# Patient Record
Sex: Female | Born: 1965
Health system: Southern US, Community
[De-identification: ages and names within clinical notes are randomized; demographics above are authoritative.]

## PROBLEM LIST (undated history)

## (undated) DIAGNOSIS — E079 Disorder of thyroid, unspecified: Secondary | ICD-10-CM

## (undated) HISTORY — PX: BREAST EXCISIONAL BIOPSY: SUR124

## (undated) HISTORY — PX: BREAST BIOPSY: SHX20

---

## 2007-01-18 ENCOUNTER — Ambulatory Visit: Payer: Self-pay | Admitting: Internal Medicine

## 2013-05-24 DIAGNOSIS — E890 Postprocedural hypothyroidism: Secondary | ICD-10-CM | POA: Insufficient documentation

## 2013-12-04 ENCOUNTER — Ambulatory Visit: Payer: Self-pay | Admitting: Internal Medicine

## 2013-12-04 LAB — CBC CANCER CENTER
BANDS NEUTROPHIL: 1 %
EOS PCT: 2 %
HCT: 24.9 % — AB (ref 35.0–47.0)
HGB: 7.1 g/dL — ABNORMAL LOW (ref 12.0–16.0)
LYMPHS PCT: 31 %
MCH: 18.3 pg — ABNORMAL LOW (ref 26.0–34.0)
MCHC: 28.4 g/dL — AB (ref 32.0–36.0)
MCV: 64 fL — ABNORMAL LOW (ref 80–100)
Monocytes: 4 %
Platelet: 261 x10 3/mm (ref 150–440)
RBC: 3.87 10*6/uL (ref 3.80–5.20)
RDW: 20.3 % — ABNORMAL HIGH (ref 11.5–14.5)
Segmented Neutrophils: 62 %
WBC: 4.6 x10 3/mm (ref 3.6–11.0)

## 2013-12-04 LAB — IRON AND TIBC
IRON SATURATION: 3 %
Iron Bind.Cap.(Total): 431 ug/dL (ref 250–450)
Iron: 15 ug/dL — ABNORMAL LOW (ref 50–170)
Unbound Iron-Bind.Cap.: 416 ug/dL

## 2013-12-04 LAB — PROTIME-INR
INR: 1
PROTHROMBIN TIME: 12.9 s (ref 11.5–14.7)

## 2013-12-04 LAB — FERRITIN: Ferritin (ARMC): 3 ng/mL — ABNORMAL LOW (ref 8–388)

## 2013-12-04 LAB — RETICULOCYTES
Absolute Retic Count: 0.0573 10*6/uL (ref 0.019–0.186)
Reticulocyte: 1.48 % (ref 0.4–3.1)

## 2013-12-04 LAB — FOLATE: Folic Acid: 9.8 ng/mL (ref 3.1–100.0)

## 2013-12-04 LAB — LACTATE DEHYDROGENASE: LDH: 215 U/L (ref 81–246)

## 2013-12-04 LAB — APTT: ACTIVATED PTT: 30.4 s (ref 23.6–35.9)

## 2013-12-06 LAB — URINE IEP, RANDOM

## 2013-12-06 LAB — PROT IMMUNOELECTROPHORES(ARMC)

## 2013-12-22 ENCOUNTER — Ambulatory Visit: Payer: Self-pay | Admitting: Internal Medicine

## 2015-03-18 ENCOUNTER — Other Ambulatory Visit: Payer: Self-pay | Admitting: Nurse Practitioner

## 2015-03-18 ENCOUNTER — Other Ambulatory Visit: Payer: Self-pay | Admitting: Internal Medicine

## 2015-03-19 ENCOUNTER — Other Ambulatory Visit: Payer: Self-pay | Admitting: Nurse Practitioner

## 2015-03-19 DIAGNOSIS — Z1231 Encounter for screening mammogram for malignant neoplasm of breast: Secondary | ICD-10-CM

## 2016-04-12 ENCOUNTER — Other Ambulatory Visit: Payer: Self-pay | Admitting: Nurse Practitioner

## 2016-04-12 DIAGNOSIS — Z0000 Encounter for general adult medical examination without abnormal findings: Secondary | ICD-10-CM

## 2016-04-12 DIAGNOSIS — Z1239 Encounter for other screening for malignant neoplasm of breast: Secondary | ICD-10-CM

## 2016-10-11 DIAGNOSIS — E782 Mixed hyperlipidemia: Secondary | ICD-10-CM | POA: Diagnosis not present

## 2016-10-11 DIAGNOSIS — Z79899 Other long term (current) drug therapy: Secondary | ICD-10-CM | POA: Diagnosis not present

## 2016-10-11 DIAGNOSIS — E890 Postprocedural hypothyroidism: Secondary | ICD-10-CM | POA: Diagnosis not present

## 2017-04-28 ENCOUNTER — Other Ambulatory Visit: Payer: Self-pay | Admitting: Nurse Practitioner

## 2017-04-28 DIAGNOSIS — E782 Mixed hyperlipidemia: Secondary | ICD-10-CM | POA: Diagnosis not present

## 2017-04-28 DIAGNOSIS — Z0000 Encounter for general adult medical examination without abnormal findings: Secondary | ICD-10-CM | POA: Diagnosis not present

## 2017-04-28 DIAGNOSIS — R7302 Impaired glucose tolerance (oral): Secondary | ICD-10-CM | POA: Diagnosis not present

## 2017-04-28 DIAGNOSIS — Z1231 Encounter for screening mammogram for malignant neoplasm of breast: Secondary | ICD-10-CM

## 2017-04-28 DIAGNOSIS — E890 Postprocedural hypothyroidism: Secondary | ICD-10-CM | POA: Diagnosis not present

## 2017-04-28 DIAGNOSIS — Z79899 Other long term (current) drug therapy: Secondary | ICD-10-CM | POA: Diagnosis not present

## 2017-05-30 ENCOUNTER — Encounter: Payer: Self-pay | Admitting: Radiology

## 2017-05-30 ENCOUNTER — Ambulatory Visit
Admission: RE | Admit: 2017-05-30 | Discharge: 2017-05-30 | Disposition: A | Discharge status: Home / Self Care | Payer: 59 | Source: Ambulatory Visit | Attending: Nurse Practitioner | Admitting: Nurse Practitioner

## 2017-05-30 DIAGNOSIS — R928 Other abnormal and inconclusive findings on diagnostic imaging of breast: Secondary | ICD-10-CM | POA: Diagnosis not present

## 2017-05-30 DIAGNOSIS — Z1231 Encounter for screening mammogram for malignant neoplasm of breast: Secondary | ICD-10-CM | POA: Diagnosis present

## 2017-06-01 ENCOUNTER — Other Ambulatory Visit: Payer: Self-pay | Admitting: *Deleted

## 2017-06-01 ENCOUNTER — Inpatient Hospital Stay
Admission: RE | Admit: 2017-06-01 | Discharge: 2017-06-01 | Disposition: A | Discharge status: Home / Self Care | Payer: Self-pay | Source: Ambulatory Visit | Attending: *Deleted | Admitting: *Deleted

## 2017-06-01 DIAGNOSIS — Z9289 Personal history of other medical treatment: Secondary | ICD-10-CM

## 2017-06-02 ENCOUNTER — Other Ambulatory Visit: Payer: Self-pay | Admitting: Nurse Practitioner

## 2017-06-02 DIAGNOSIS — R928 Other abnormal and inconclusive findings on diagnostic imaging of breast: Secondary | ICD-10-CM

## 2017-06-02 DIAGNOSIS — N6489 Other specified disorders of breast: Secondary | ICD-10-CM

## 2017-06-09 ENCOUNTER — Ambulatory Visit
Admission: RE | Admit: 2017-06-09 | Discharge: 2017-06-09 | Disposition: A | Discharge status: Home / Self Care | Payer: 59 | Source: Ambulatory Visit | Attending: Nurse Practitioner | Admitting: Nurse Practitioner

## 2017-06-09 DIAGNOSIS — R928 Other abnormal and inconclusive findings on diagnostic imaging of breast: Secondary | ICD-10-CM | POA: Diagnosis present

## 2017-06-09 DIAGNOSIS — N6489 Other specified disorders of breast: Secondary | ICD-10-CM | POA: Diagnosis not present

## 2017-08-01 DIAGNOSIS — Z1211 Encounter for screening for malignant neoplasm of colon: Secondary | ICD-10-CM | POA: Diagnosis not present

## 2017-08-01 DIAGNOSIS — Z5181 Encounter for therapeutic drug level monitoring: Secondary | ICD-10-CM | POA: Diagnosis not present

## 2017-08-01 DIAGNOSIS — E039 Hypothyroidism, unspecified: Secondary | ICD-10-CM | POA: Diagnosis not present

## 2017-08-01 DIAGNOSIS — Z01818 Encounter for other preprocedural examination: Secondary | ICD-10-CM | POA: Diagnosis not present

## 2017-08-19 DIAGNOSIS — D123 Benign neoplasm of transverse colon: Secondary | ICD-10-CM | POA: Diagnosis not present

## 2017-08-19 DIAGNOSIS — K648 Other hemorrhoids: Secondary | ICD-10-CM | POA: Diagnosis not present

## 2017-08-19 DIAGNOSIS — K640 First degree hemorrhoids: Secondary | ICD-10-CM | POA: Diagnosis not present

## 2017-08-19 DIAGNOSIS — D126 Benign neoplasm of colon, unspecified: Secondary | ICD-10-CM | POA: Diagnosis not present

## 2017-08-19 DIAGNOSIS — Z1211 Encounter for screening for malignant neoplasm of colon: Secondary | ICD-10-CM | POA: Diagnosis not present

## 2017-08-22 ENCOUNTER — Encounter: Payer: Self-pay | Admitting: Emergency Medicine

## 2017-08-22 ENCOUNTER — Other Ambulatory Visit: Payer: Self-pay

## 2017-08-22 ENCOUNTER — Ambulatory Visit (INDEPENDENT_AMBULATORY_CARE_PROVIDER_SITE_OTHER): Payer: 59

## 2017-08-22 ENCOUNTER — Ambulatory Visit
Admission: EM | Admit: 2017-08-22 | Discharge: 2017-08-22 | Disposition: A | Discharge status: Home / Self Care | Payer: 59 | Attending: Family Medicine | Admitting: Family Medicine

## 2017-08-22 DIAGNOSIS — M79671 Pain in right foot: Secondary | ICD-10-CM

## 2017-08-22 DIAGNOSIS — W19XXXA Unspecified fall, initial encounter: Secondary | ICD-10-CM

## 2017-08-22 DIAGNOSIS — S92354A Nondisplaced fracture of fifth metatarsal bone, right foot, initial encounter for closed fracture: Secondary | ICD-10-CM

## 2017-08-22 HISTORY — DX: Disorder of thyroid, unspecified: E07.9

## 2017-08-22 NOTE — ED Provider Notes (Signed)
[facility] ____________________________________________  Time seen: Approximately 4:31 PM  I have reviewed the triage vital signs and the nursing notes.   HISTORY  Chief Complaint Foot Pain (right)   HPI Bailey Dennis is a 52 y.o. female presenting for evaluation of right lateral foot pain that is been present for the last 1 week.  Patient reports last week she had been sitting in her recliner with her right foot underneath her leg on her laptop.  States when she went to stand up quickly her foot had fallen asleep, and when she stepped caused her to roll her foot and fall.  States she heard a pop to her foot with associated pain.  Denies any other pain or injuries from fall.  States that she has been given at time but pain continues prompting her to come in.  States did feel some slight paresthesias today, no loss of sensation completely.  Denies further pain radiation or other injury.  Reports has not taken over-the-counter medications due to recent colonoscopy that was scheduled for this past week.  Reports otherwise feels well.  His continue remain ambulatory with pain. Denies recent sickness. Denies recent antibiotic use.    Past Medical History:  Diagnosis Date  . Thyroid disease     There are no active problems to display for this patient.   Past Surgical History:  Procedure Laterality Date  . BREAST BIOPSY Left    neg     No current facility-administered medications for this encounter.   Current Outpatient Medications:  .  ferrous sulfate 325 (65 FE) MG tablet, Take by mouth., Disp: , Rfl:  .  levothyroxine (SYNTHROID, LEVOTHROID) 175 MCG tablet, Take 175 mcg by mouth daily before breakfast., Disp: , Rfl:  .  Multiple Vitamin (MULTI-VITAMINS) TABS, Take by mouth., Disp: , Rfl:   Allergies Patient has no known allergies.  Family History  Problem Relation Age of Onset  . Breast cancer Mother 40  . Diabetes Father     Social History Social History     Tobacco Use  . Smoking status: Never Smoker  . Smokeless tobacco: Never Used  Substance Use Topics  . Alcohol use: Yes    Comment: occassionally  . Drug use: Never    Review of Systems Constitutional: No fever/chills Cardiovascular: Denies chest pain. Respiratory: Denies shortness of breath. Gastrointestinal: No abdominal pain.  Musculoskeletal: Negative for back pain. As above.  Skin: Negative for rash.  ____________________________________________   PHYSICAL EXAM:  VITAL SIGNS: ED Triage Vitals  Enc Vitals Group     BP 08/22/17 1501 126/83     Pulse Rate 08/22/17 1501 92     Resp 08/22/17 1501 16     Temp 08/22/17 1501 98.4 F (36.9 C)     Temp Source 08/22/17 1501 Oral     SpO2 08/22/17 1501 100 %     Weight 08/22/17 1501 194 lb (88 kg)     Height 08/22/17 1501 5\' 7"  (1.702 m)     Head Circumference --      Peak Flow --      Pain Score 08/22/17 1500 3     Pain Loc --      Pain Edu? --      Excl. in [place]? --     Constitutional: Alert and oriented. Well appearing and in no acute distress. Cardiovascular: Normal rate, regular rhythm. Grossly normal heart sounds.  Good peripheral circulation. Respiratory: Normal respiratory effort without tachypnea nor retractions. Breath sounds are  clear and equal bilaterally. No wheezes, rales, rhonchi. Musculoskeletal:   No midline cervical, thoracic or lumbar tenderness to palpation. Bilateral pedal pulses equal and easily palpated. Except: Right lateral foot at base of fifth metatarsal mild to moderate tenderness to direct palpation with mild associated swelling, no ecchymosis, right foot and right lower extremity otherwise nontender, normal sensation and capillary refill to right foot. Neurologic:  Normal speech and language.  Skin:  Skin is warm, dry and intact. No rash noted. Psychiatric: Mood and affect are normal. Speech and behavior are normal. Patient exhibits appropriate insight and judgment    ___________________________________________   LABS (all labs ordered are listed, but only abnormal results are displayed)  Labs Reviewed - No data to display  RADIOLOGY  Dg Foot Complete Right  Result Date: 08/22/2017 CLINICAL DATA:  Numbness in the foot after twisting injury 1 week ago. EXAM: RIGHT FOOT COMPLETE - 3+ VIEW COMPARISON:  None. FINDINGS: Nondisplaced avulsion fracture of the base of the fifth metatarsal. No other abnormality. IMPRESSION: Nondisplaced avulsion fracture of the base of the fifth metatarsal. Electronically Signed   By: Nelson Chimes M.D.   On: 08/22/2017 15:59   ____________________________________________   PROCEDURES Procedures    INITIAL IMPRESSION / ASSESSMENT AND PLAN / ED COURSE  Pertinent labs & imaging results that were available during my care of the patient were reviewed by me and considered in my medical decision making (see chart for details).  Well-appearing patient.  No acute distress.  Mechanical injury leading to right foot pain.  X-ray reviewed, nondisplaced fracture of the base of fifth metatarsal.  Injury occurred 1 week ago patient has remained ambulatory.  Will place patient in cam walker boot and refer to podiatry.  Encouraged follow-up in 1 week.  Denies need for pain medication.  Encourage rest, ice and support.  Discussed follow up with Primary care physician this week. Discussed follow up and return parameters including no resolution or any worsening concerns. Patient verbalized understanding and agreed to plan.   ____________________________________________   FINAL CLINICAL IMPRESSION(S) / ED DIAGNOSES  Final diagnoses:  Nondisplaced fracture of fifth metatarsal bone, right foot, initial encounter for closed fracture     ED Discharge Orders    None       Note: This dictation was prepared with Dragon dictation along with smaller phrase technology. Any transcriptional errors that result from this process are  unintentional.         Marylene Land, NP 08/22/17 1739

## 2017-08-22 NOTE — ED Triage Notes (Signed)
Patient in today c/o right foot pain after falling on 08/15/17. Patient was sitting on the cough working on her laptop and she went to get up, her foot had gone to sleep, and patient fell. She states she heard something pop.

## 2017-08-22 NOTE — Discharge Instructions (Addendum)
Rest. Ice. Use boot.   Follow up with podiatry as discussed. Return to Urgent care for new or worsening concerns.

## 2017-10-27 DIAGNOSIS — E890 Postprocedural hypothyroidism: Secondary | ICD-10-CM | POA: Diagnosis not present

## 2017-10-27 DIAGNOSIS — E782 Mixed hyperlipidemia: Secondary | ICD-10-CM | POA: Diagnosis not present

## 2017-10-27 DIAGNOSIS — Z5181 Encounter for therapeutic drug level monitoring: Secondary | ICD-10-CM | POA: Diagnosis not present

## 2017-12-12 DIAGNOSIS — E890 Postprocedural hypothyroidism: Secondary | ICD-10-CM | POA: Diagnosis not present

## 2018-10-31 ENCOUNTER — Other Ambulatory Visit: Payer: Self-pay | Admitting: Nurse Practitioner

## 2018-10-31 DIAGNOSIS — Z1231 Encounter for screening mammogram for malignant neoplasm of breast: Secondary | ICD-10-CM

## 2018-11-14 IMAGING — MG MM DIGITAL DIAGNOSTIC UNILAT*R* W/ TOMO W/ CAD
6 series · 6 of 14 positions shown · non-contrast
Comparison: 05/30/2017

CLINICAL DATA: 51-year-old patient recalled from 2D screening
mammogram for evaluation of asymmetry in the right breast identified
on the MLO view only.

EXAM:
2D DIGITAL DIAGNOSTIC UNILATERAL RIGHT MAMMOGRAM WITH CAD AND
ADJUNCT TOMO

[R MLO synth-2D]
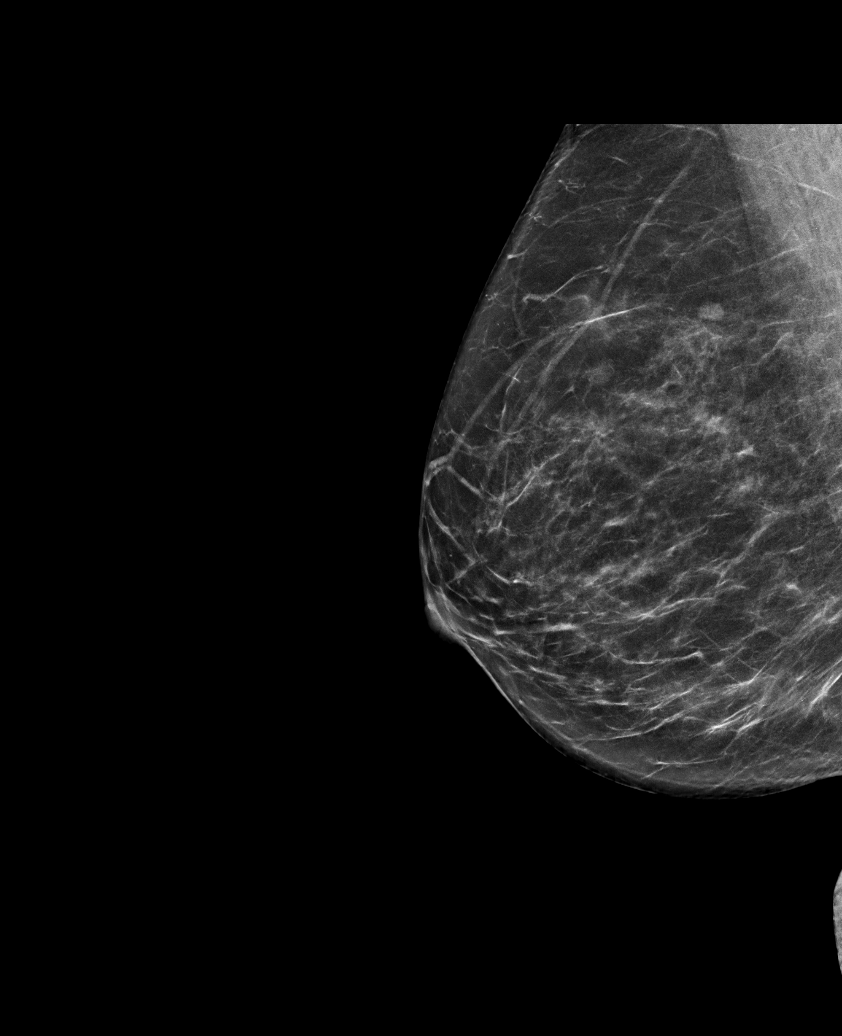

[R CC synth-2D]
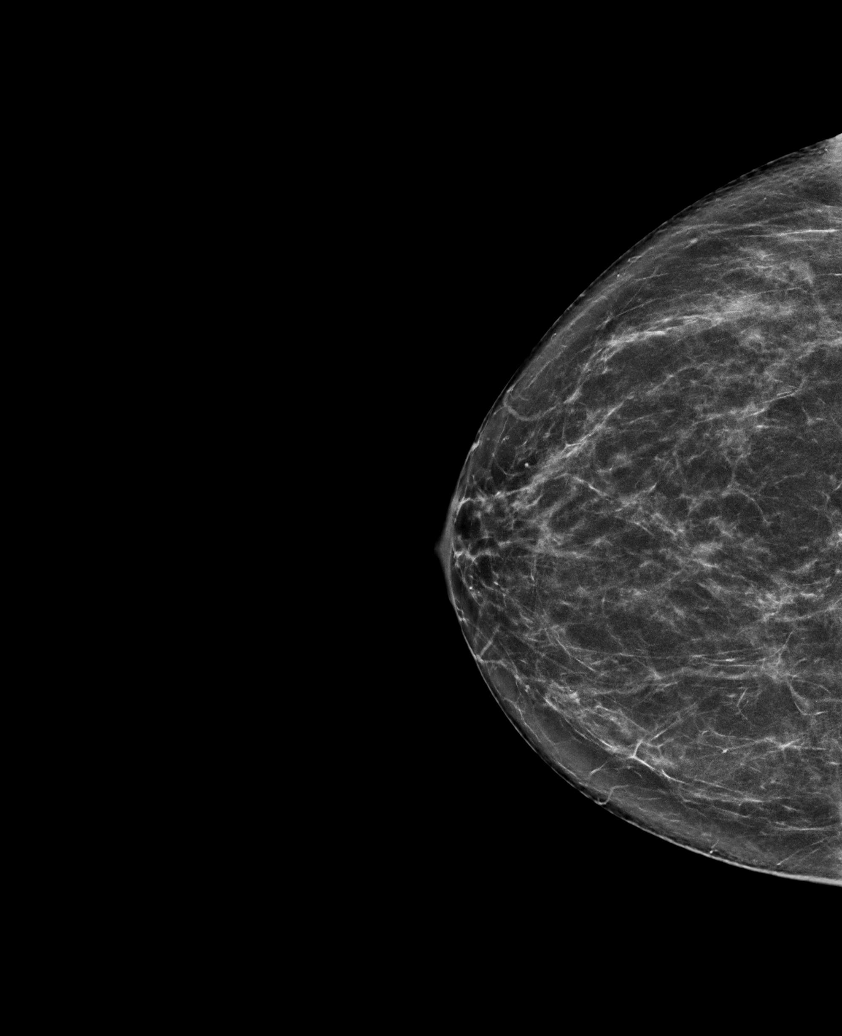

[R CC]
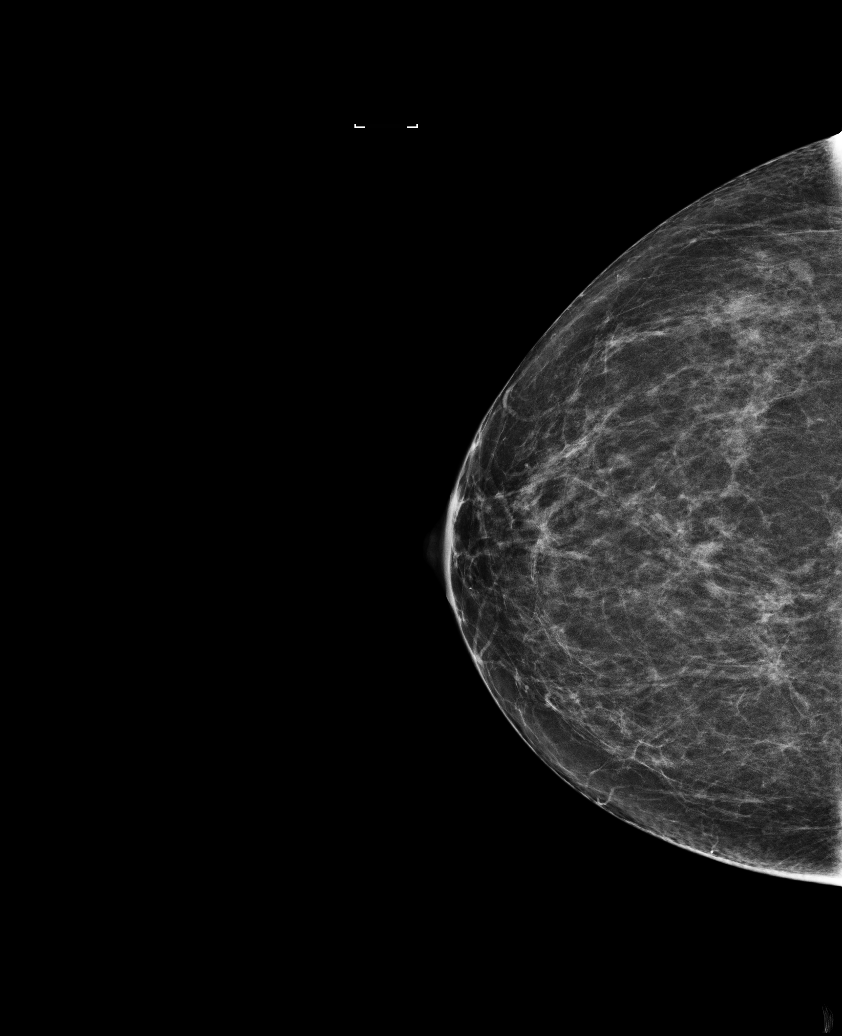

[R MLO]
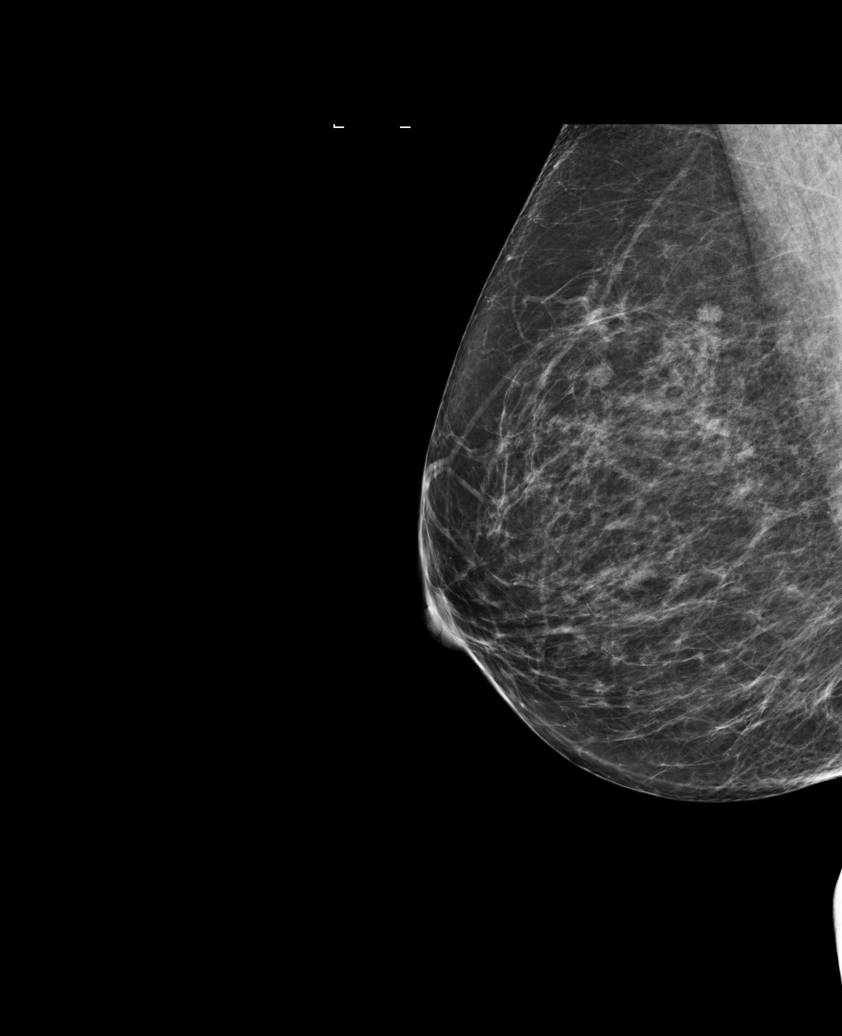

[R MLO tomo · tomo slice 37/73.0]
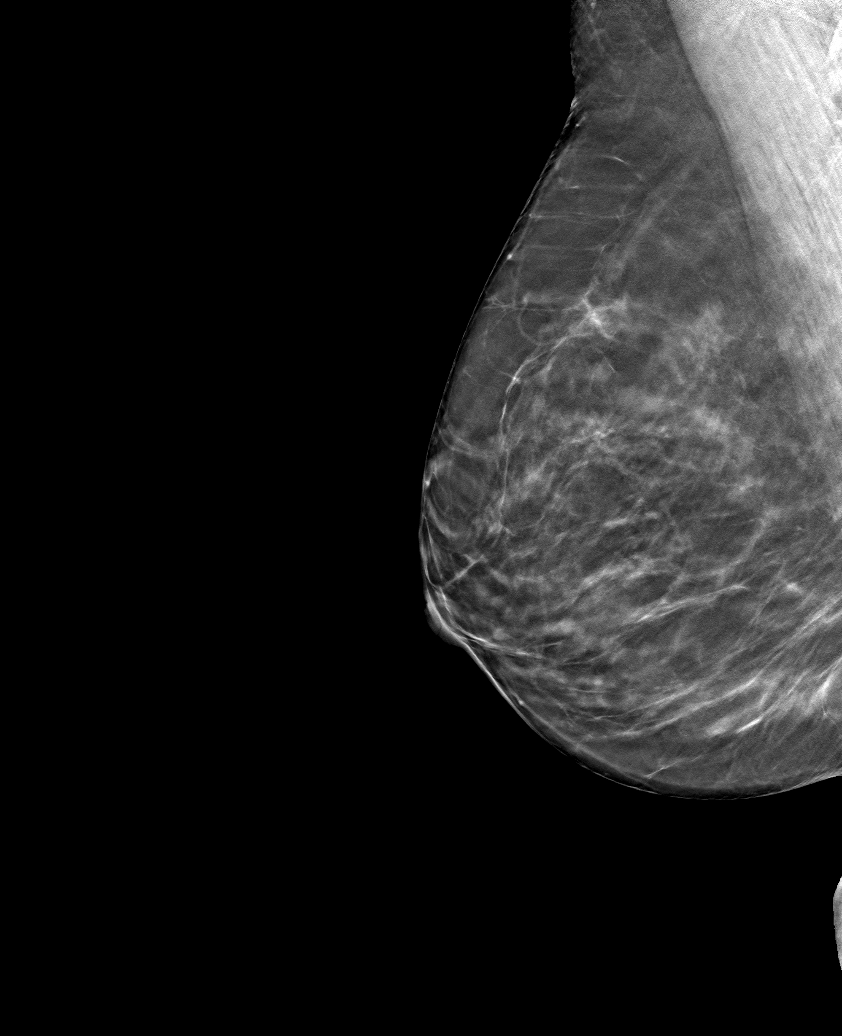

[R CC tomo · tomo slice 35/68.0]
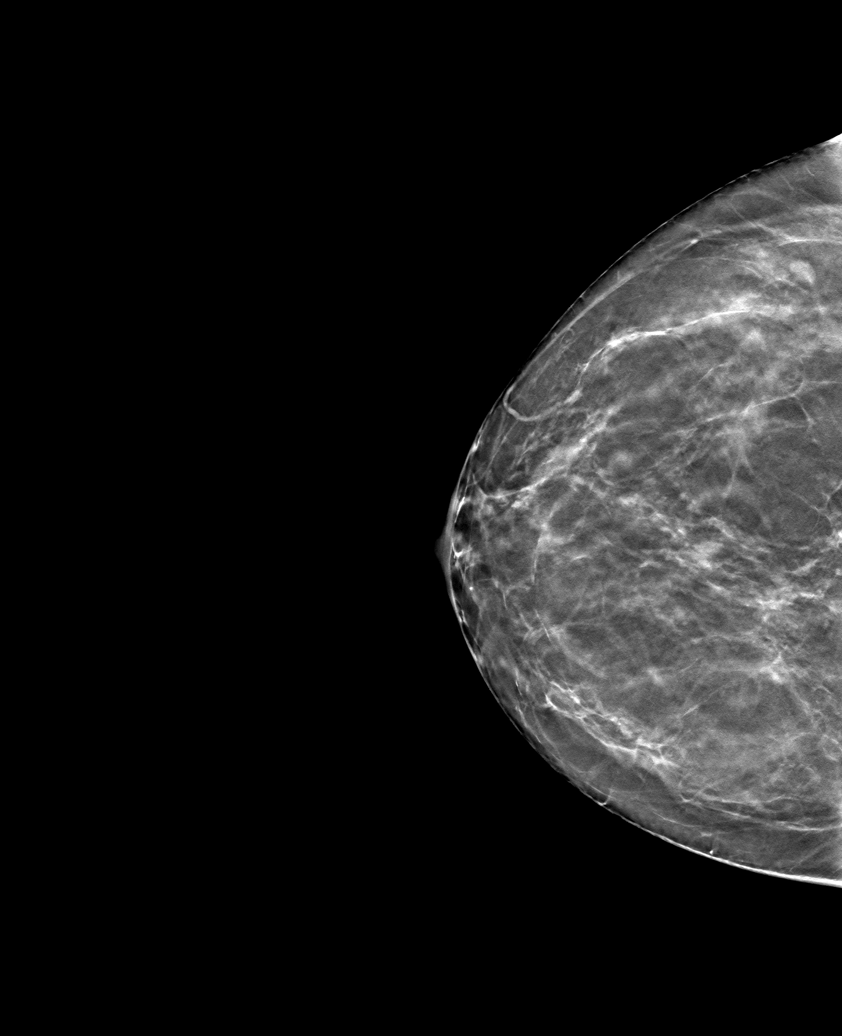

[6 of 14 positions shown; findings below may reference images not displayed]

ACR Breast Density Category b: There are scattered areas of
fibroglandular density.
FINDINGS: Whole breast CC and MLO views with tomography are performed. Normal
fibroglandular tissue is seen in the superior right breast in the
region of a possible asymmetry identified on the recent 2D screening
mammogram. No mass or architectural distortion is identified in the
right breast to suggest malignancy.

Mammographic images were processed with CAD.
IMPRESSION: No evidence of malignancy in the right breast.

RECOMMENDATION:
Screening mammogram in one year.(Code:[id number])

I have discussed the findings and recommendations with the patient.
Results were also provided in writing at the conclusion of the
visit. If applicable, a reminder letter will be sent to the patient
regarding the next appointment.

BI-RADS CATEGORY  1: Negative.

## 2019-06-05 ENCOUNTER — Ambulatory Visit
Admission: RE | Admit: 2019-06-05 | Discharge: 2019-06-05 | Disposition: A | Discharge status: Home / Self Care | Payer: 59 | Source: Ambulatory Visit | Attending: Nurse Practitioner | Admitting: Nurse Practitioner

## 2019-06-05 ENCOUNTER — Other Ambulatory Visit: Payer: Self-pay

## 2019-06-05 ENCOUNTER — Encounter (INDEPENDENT_AMBULATORY_CARE_PROVIDER_SITE_OTHER): Payer: Self-pay

## 2019-06-05 DIAGNOSIS — Z1231 Encounter for screening mammogram for malignant neoplasm of breast: Secondary | ICD-10-CM | POA: Diagnosis not present

## 2020-03-24 ENCOUNTER — Other Ambulatory Visit: Payer: Self-pay | Admitting: Nurse Practitioner

## 2020-03-24 DIAGNOSIS — Z1231 Encounter for screening mammogram for malignant neoplasm of breast: Secondary | ICD-10-CM

## 2020-03-31 ENCOUNTER — Inpatient Hospital Stay: Payer: 59 | Attending: Hematology and Oncology | Admitting: Hematology and Oncology

## 2020-03-31 ENCOUNTER — Other Ambulatory Visit: Payer: 59

## 2020-03-31 ENCOUNTER — Other Ambulatory Visit: Payer: Self-pay

## 2020-03-31 ENCOUNTER — Encounter: Payer: Self-pay | Admitting: Hematology and Oncology

## 2020-03-31 DIAGNOSIS — Z79899 Other long term (current) drug therapy: Secondary | ICD-10-CM

## 2020-03-31 DIAGNOSIS — D649 Anemia, unspecified: Secondary | ICD-10-CM | POA: Insufficient documentation

## 2020-03-31 DIAGNOSIS — Z803 Family history of malignant neoplasm of breast: Secondary | ICD-10-CM | POA: Diagnosis not present

## 2020-03-31 DIAGNOSIS — Z833 Family history of diabetes mellitus: Secondary | ICD-10-CM | POA: Diagnosis not present

## 2020-03-31 LAB — CBC WITH DIFFERENTIAL/PLATELET
Abs Immature Granulocytes: 0.02 10*3/uL (ref 0.00–0.07)
Basophils Absolute: 0 10*3/uL (ref 0.0–0.1)
Basophils Relative: 0 %
Eosinophils Absolute: 0.1 10*3/uL (ref 0.0–0.5)
Eosinophils Relative: 1 %
HCT: 47.9 % — ABNORMAL HIGH (ref 36.0–46.0)
Hemoglobin: 16.1 g/dL — ABNORMAL HIGH (ref 12.0–15.0)
Immature Granulocytes: 0 %
Lymphocytes Relative: 27 %
Lymphs Abs: 2.3 10*3/uL (ref 0.7–4.0)
MCH: 29.8 pg (ref 26.0–34.0)
MCHC: 33.6 g/dL (ref 30.0–36.0)
MCV: 88.7 fL (ref 80.0–100.0)
Monocytes Absolute: 0.7 10*3/uL (ref 0.1–1.0)
Monocytes Relative: 8 %
Neutro Abs: 5.4 10*3/uL (ref 1.7–7.7)
Neutrophils Relative %: 64 %
Platelets: 231 10*3/uL (ref 150–400)
RBC: 5.4 MIL/uL — ABNORMAL HIGH (ref 3.87–5.11)
RDW: 13 % (ref 11.5–15.5)
WBC: 8.5 10*3/uL (ref 4.0–10.5)
nRBC: 0 % (ref 0.0–0.2)

## 2020-03-31 LAB — COMPREHENSIVE METABOLIC PANEL
ALT: 20 U/L (ref 0–44)
AST: 18 U/L (ref 15–41)
Albumin: 4.1 g/dL (ref 3.5–5.0)
Alkaline Phosphatase: 86 U/L (ref 38–126)
Anion gap: 8 (ref 5–15)
BUN: 17 mg/dL (ref 6–20)
CO2: 23 mmol/L (ref 22–32)
Calcium: 8.6 mg/dL — ABNORMAL LOW (ref 8.9–10.3)
Chloride: 101 mmol/L (ref 98–111)
Creatinine, Ser: 0.83 mg/dL (ref 0.44–1.00)
GFR, Estimated: >60 mL/min (ref >60)
Glucose, Bld: 95 mg/dL (ref 70–99)
Potassium: 3.6 mmol/L (ref 3.5–5.1)
Sodium: 132 mmol/L — ABNORMAL LOW (ref 135–145)
Total Bilirubin: 0.5 mg/dL (ref 0.3–1.2)
Total Protein: 7.5 g/dL (ref 6.5–8.1)

## 2020-03-31 LAB — URINALYSIS, COMPLETE (UACMP) WITH MICROSCOPIC
Bilirubin Urine: NEGATIVE
Glucose, UA: NEGATIVE mg/dL
Leukocytes,Ua: NEGATIVE
Nitrite: NEGATIVE
Protein, ur: NEGATIVE mg/dL
Specific Gravity, Urine: 1.02 (ref 1.005–1.030)
pH: 5 (ref 5.0–8.0)

## 2020-03-31 LAB — IRON AND TIBC
Iron: 74 ug/dL (ref 28–170)
Saturation Ratios: 25 % (ref 10.4–31.8)
TIBC: 302 ug/dL (ref 250–450)
UIBC: 228 ug/dL

## 2020-03-31 LAB — FERRITIN: Ferritin: 25 ng/mL (ref 11–307)

## 2020-03-31 NOTE — Progress Notes (Signed)
[facility]  [address] [place], [place] [address] Phone: [phone]  Fax: [phone]   Clinic Day:  03/31/2020  Referring physician: Sallee Lange, *  Chief Complaint: Bailey Dennis is a 54 y.o. female with hereditary hemochromatosis who is referred in consultation by Gaetano Net, NP for assessment and management.   HPI: The patient requested labs to diagnose hemochromatosis after her mother and several sisters were diagnosed. Hemochromatosis testing on 02/05/2020 revealed two copies of C282Y. Additional labs included a hematocrit of 47.7, hemoglobin 15.6, platelets 201,000, WBC 7,200. CrCl was 52 ml/min. Ferritin was 27 with an iron saturation of 28% and TIBC of 331.1.  Labs followed: 03/18/2015: Hematocrit 34.3, hemoglobin   9.8, platelets 304,000, WBC 7,800. 04/19/2016: Hematocrit 38.5, hemoglobin 12.1, platelets 280,000, WBC 8,100. 10/31/2018: Hematocrit 43.7, hemoglobin 14.3, platelets 175,000, WBC 6,600.  Symptomatically, she has been fine. She states that she needs to get her eyes checked. Her knee aches. She denies fevers, sweats, headaches, runny nose, sore throat, cough, shortness of breath, chest pain, palpitations, nausea, vomiting, diarrhea, reflux, urinary symptoms, skin changes, numbness, weakness, and balance or coordination problems.  The patient typically eats oatmeal, coffee, salads, wraps, and pizza. She eats chicken twice per week, red meat once per week, and fish once per week. She denies any cravings.  She has been anemic for a long time. Her periods used to be very heavy. She went through menopause about 14 months ago. She used to take oral iron but stopped when her mother was diagnosed with hemochromatosis. She denies hematuria. She denies a history of hepatitis.  She has had an underactive thyroid since age 20. She had radiation treatment. Since then, she has struggled with her weight.    Past Medical History:   Diagnosis Date  . Thyroid disease     Past Surgical History:  Procedure Laterality Date  . BREAST BIOPSY Left    neg    Family History  Problem Relation Age of Onset  . Breast cancer Mother 39  . Diabetes Father     Social History:  reports that she has never smoked. She has never used smokeless tobacco. She reports current alcohol use. She reports that she does not use drugs. She has a couple of glasses of wine on the weekends. She denies tobacco use. She denies exposure to radiation or toxins. She has two children (18 and 19). She works for a [employer] in [place]. The patient is alone today.  Allergies: No Known Allergies  Current Medications: Current Outpatient Medications  Medication Sig Dispense Refill  . ferrous sulfate 325 (65 FE) MG tablet Take by mouth.    . Multiple Vitamin (MULTI-VITAMINS) TABS Take by mouth.    . levothyroxine (SYNTHROID, LEVOTHROID) 175 MCG tablet Take 175 mcg by mouth daily before breakfast.     No current facility-administered medications for this visit.    Review of Systems  Constitutional: Negative for chills, diaphoresis, fever, malaise/fatigue and weight loss.  HENT: Negative for congestion, ear discharge, ear pain, hearing loss, nosebleeds, sinus pain, sore throat and tinnitus.   Eyes: Negative for blurred vision.       Vision changes.  Respiratory: Negative for cough, hemoptysis, sputum production and shortness of breath.   Cardiovascular: Negative for chest pain, palpitations and leg swelling.  Gastrointestinal: Negative for abdominal pain, blood in stool, constipation, diarrhea, heartburn, melena, nausea and vomiting.       Denies any cravings.  Genitourinary: Negative for dysuria, frequency,  hematuria and urgency.  Musculoskeletal: Positive for joint pain (knees). Negative for back pain, myalgias and neck pain.  Skin: Negative for itching and rash.  Neurological: Negative for dizziness, tingling, sensory change, weakness and  headaches.  Endo/Heme/Allergies: Does not bruise/bleed easily.  Psychiatric/Behavioral: Negative for depression and memory loss. The patient is not nervous/anxious and does not have insomnia.   All other systems reviewed and are negative.  Performance status (ECOG): 0  Vitals Blood pressure 112/62, pulse 78, temperature 97.8 F (36.6 C), temperature source Tympanic, resp. rate 20, weight 217 lb 11.3 oz (98.8 kg), SpO2 97 %.   Physical Exam Vitals and nursing note reviewed.  Constitutional:      General: She is not in acute distress.    Appearance: She is not diaphoretic.  HENT:     Head: Normocephalic and atraumatic.     Comments: Long blonde hair.    Mouth/Throat:     Mouth: Mucous membranes are moist.     Pharynx: Oropharynx is clear.  Eyes:     General: No scleral icterus.    Extraocular Movements: Extraocular movements intact.     Conjunctiva/sclera: Conjunctivae normal.     Pupils: Pupils are equal, round, and reactive to light.     Comments: Blue eyes.  Cardiovascular:     Rate and Rhythm: Normal rate and regular rhythm.     Heart sounds: Normal heart sounds. No murmur heard.   Pulmonary:     Effort: Pulmonary effort is normal. No respiratory distress.     Breath sounds: Normal breath sounds. No wheezing or rales.  Chest:     Chest wall: No tenderness.  Abdominal:     General: Bowel sounds are normal. There is no distension.     Palpations: Abdomen is soft. There is no mass.     Tenderness: There is no abdominal tenderness. There is no guarding or rebound.  Musculoskeletal:        General: No swelling or tenderness. Normal range of motion.     Cervical back: Normal range of motion and neck supple.  Lymphadenopathy:     Head:     Right side of head: No preauricular, posterior auricular or occipital adenopathy.     Left side of head: No preauricular, posterior auricular or occipital adenopathy.     Cervical: No cervical adenopathy.     Upper Body:     Right upper  body: No supraclavicular or axillary adenopathy.     Left upper body: No supraclavicular or axillary adenopathy.     Lower Body: No right inguinal adenopathy. No left inguinal adenopathy.  Skin:    General: Skin is warm and dry.  Neurological:     Mental Status: She is alert and oriented to person, place, and time.  Psychiatric:        Behavior: Behavior normal.        Thought Content: Thought content normal.        Judgment: Judgment normal.    Appointment on 03/31/2020  Component Date Value Ref Range Status  . Color, Urine 03/31/2020 YELLOW  YELLOW Final  . APPearance 03/31/2020 CLEAR  CLEAR Final  . Specific Gravity, Urine 03/31/2020 1.020  1.005 - 1.030 Final  . pH 03/31/2020 5.0  5.0 - 8.0 Final  . Glucose, UA 03/31/2020 NEGATIVE  NEGATIVE mg/dL Final  . Hgb urine dipstick 03/31/2020 TRACE* NEGATIVE Final  . Bilirubin Urine 03/31/2020 NEGATIVE  NEGATIVE Final  . Ketones, ur 03/31/2020 TRACE* NEGATIVE mg/dL Final  .  Protein, ur 03/31/2020 NEGATIVE  NEGATIVE mg/dL Final  . Nitrite 03/31/2020 NEGATIVE  NEGATIVE Final  . Chalmers Guest 03/31/2020 NEGATIVE  NEGATIVE Final  . Squamous Epithelial / LPF 03/31/2020 0-5  0 - 5 Final  . WBC, UA 03/31/2020 0-5  0 - 5 WBC/hpf Final  . RBC / HPF 03/31/2020 0-5  0 - 5 RBC/hpf Final  . Bacteria, UA 03/31/2020 MANY* NONE SEEN Final   Performed at [facility], [address] [place], [place] [address]  . AFP, Serum, Tumor Marker 03/31/2020 1.8  0.0 - 8.3 ng/mL Final   Comment: (NOTE) Roche Diagnostics Electrochemiluminescence Immunoassay (ECLIA) Values obtained with different assay methods or kits cannot be used interchangeably.  Results cannot be interpreted as absolute evidence of the presence or absence of malignant disease. This test is not interpretable in pregnant females. Performed At: [facility] [place], [place] [id number] Rush Farmer MD UU:[id number]   . HCV Ab 03/31/2020 NON REACTIVE   NON REACTIVE Final   Comment: (NOTE) Nonreactive HCV antibody screen is consistent with no HCV infections,  unless recent infection is suspected or other evidence exists to indicate HCV infection.  Performed at [facility], [place] [address], [place], [place] [address]   . Hep B Core Total Ab 03/31/2020 NON REACTIVE  NON REACTIVE Final   Performed at [facility], [place] [address] [place], [place] [address]  . Iron 03/31/2020 74  28 - 170 ug/dL Final  . TIBC 03/31/2020 302  250 - 450 ug/dL Final  . Saturation Ratios 03/31/2020 25  10.4 - 31.8 % Final  . UIBC 03/31/2020 228  ug/dL Final   Performed at [facility], [address] [place], [place] [address]  . Ferritin 03/31/2020 25  11 - 307 ng/mL Final   Performed at [facility], [address] [place], [place] [address]  . Sodium 03/31/2020 132* 135 - 145 mmol/L Final  . Potassium 03/31/2020 3.6  3.5 - 5.1 mmol/L Final  . Chloride 03/31/2020 101  98 - 111 mmol/L Final  . CO2 03/31/2020 23  22 - 32 mmol/L Final  . Glucose, Bld 03/31/2020 95  70 - 99 mg/dL Final   Glucose reference range applies only to samples taken after fasting for at least 8 hours.  . BUN 03/31/2020 17  6 - 20 mg/dL Final  . Creatinine, Ser 03/31/2020 0.83  0.44 - 1.00 mg/dL Final  . Calcium 03/31/2020 8.6* 8.9 - 10.3 mg/dL Final  . Total Protein 03/31/2020 7.5  6.5 - 8.1 g/dL Final  . Albumin 03/31/2020 4.1  3.5 - 5.0 g/dL Final  . AST 03/31/2020 18  15 - 41 U/L Final  . ALT 03/31/2020 20  0 - 44 U/L Final  . Alkaline Phosphatase 03/31/2020 86  38 - 126 U/L Final  . Total Bilirubin 03/31/2020 0.5  0.3 - 1.2 mg/dL Final  . GFR, Estimated 03/31/2020 >60  >60 mL/min Final   Comment: (NOTE) Calculated using the CKD-EPI Creatinine Equation (2021)   . Anion gap 03/31/2020 8  5 - 15 Final   Performed at [facility], [address], [place], [place] [address]  . WBC 03/31/2020 8.5  4.0 - 10.5 K/uL Final  . RBC  03/31/2020 5.40* 3.87 - 5.11 MIL/uL Final  . Hemoglobin 03/31/2020 16.1* 12.0 - 15.0 g/dL Final  . HCT 03/31/2020 47.9* 36 - 46 % Final  . MCV 03/31/2020 88.7  80.0 - 100.0 fL  Final  . MCH 03/31/2020 29.8  26.0 - 34.0 pg Final  . MCHC 03/31/2020 33.6  30.0 - 36.0 g/dL Final  . RDW 03/31/2020 13.0  11.5 - 15.5 % Final  . Platelets 03/31/2020 231  150 - 400 K/uL Final  . nRBC 03/31/2020 0.0  0.0 - 0.2 % Final  . Neutrophils Relative % 03/31/2020 64  % Final  . Neutro Abs 03/31/2020 5.4  1.7 - 7.7 K/uL Final  . Lymphocytes Relative 03/31/2020 27  % Final  . Lymphs Abs 03/31/2020 2.3  0.7 - 4.0 K/uL Final  . Monocytes Relative 03/31/2020 8  % Final  . Monocytes Absolute 03/31/2020 0.7  0.1 - 1.0 K/uL Final  . Eosinophils Relative 03/31/2020 1  % Final  . Eosinophils Absolute 03/31/2020 0.1  0.0 - 0.5 K/uL Final  . Basophils Relative 03/31/2020 0  % Final  . Basophils Absolute 03/31/2020 0.0  0.0 - 0.1 K/uL Final  . Immature Granulocytes 03/31/2020 0  % Final  . Abs Immature Granulocytes 03/31/2020 0.02  0.00 - 0.07 K/uL Final   Performed at [facility], [address], [place], [place] [address]    Assessment:  BRIENNE LIGUORI is a 54 y.o. female with hereditary hemochromatosis.  She is homozygous for C282Y.  She was tested after her mothers and sister were diagnosed with hemochromatosis.  Labs on 02/05/2020 revealed a hematocrit of 47.7, hemoglobin 15.6, platelets 201,000, WBC 7,200. Ferritin was 27 with an iron saturation of 28% and TIBC of 331.1.  She has a history of anemia felt secondary to menorrhagia.  She is no longer on oral iron.  She received the COVID-19 vaccine on 08/27/2019 and 09/17/2019.  Symptomatically, she denies any concerns.  Exam is stable.  Plan: 1.   Labs today: CBC with diff, CMP, ferritin, iron studies, hepatitis B core antibody total, hepatitis C antibody, AFP. 2.   Hereditary hemochromatosis   Patient is homozygous for C282Y.  Discuss genetics  and long term management with a phlebotomy program.   Ferritin goal is 50-100.  Discuss surveillance for hepatocellular carcinoma with AFP and RUQ ultrasound every 6 months.  Discuss testing for hepatitis and consideration of hepatitis B vaccine series. 3.   History of iron deficiency  Patient notes heavy menses.  Patient post-menopausal x 14 months.  Ferritin was 27 (surprising low) on 02/05/2020.  Check urinalysis to r/o microscopic hematuria.  Patient denies h/o reflux or gastritis.  Discus importance of screening for GI losses (colonoscopy). 4.   RUQ ultrasound on 04/07/2020. 5.   RTC in 3 months for MD assessment, labs (CBC, ferritin), and review of ultrasound.  I discussed the assessment and treatment plan with the patient.  The patient was provided an opportunity to ask questions and all were answered.  The patient agreed with the plan and demonstrated an understanding of the instructions.  The patient was advised to call back if the symptoms worsen or if the condition fails to improve as anticipated.  I provided 26 minutes of face-to-face time during this this encounter and > 50% was spent counseling as documented under my assessment and plan.  An additional 10 minutes were spent reviewing her chart (Epic and Care Everywhere) including notes, labs, and imaging studies.    Teon Hudnall C. Mike Gip, MD, PhD    03/31/2020, 6:00 PM  I, Mirian Mo Tufford, am acting as Education administrator for [employer]. Mike Gip, MD, PhD.  I, Kerisha Goughnour C. Mike Gip, MD, have reviewed the above documentation for accuracy  and completeness, and I agree with the above.

## 2020-04-01 LAB — HEPATITIS B CORE ANTIBODY, TOTAL: Hep B Core Total Ab: NONREACTIVE

## 2020-04-01 LAB — HEPATITIS C ANTIBODY: HCV Ab: NONREACTIVE

## 2020-04-02 ENCOUNTER — Telehealth: Payer: Self-pay

## 2020-04-02 LAB — AFP TUMOR MARKER: AFP, Serum, Tumor Marker: 1.8 ng/mL (ref 0.0–8.3)

## 2020-04-02 NOTE — Telephone Encounter (Signed)
Spoke with the patient to inform her that her sodium was slightly low, calcium was low and hemoglobin is 16.1 ( per dr Mike Gip anything above 16 is high for a women. I have informed the patient to drink fluids with electrolytes and start a Calcium supplementation.The patient denies any second hand smoke exposure, No sleep Apnea, and she does not have a carbon monitor in her home. Dr Mike Gip would like to repeat labs in 1 month and if hemoglobin still high Dr Mike Gip will perform a work -up.

## 2020-04-07 ENCOUNTER — Ambulatory Visit
Admission: RE | Admit: 2020-04-07 | Discharge: 2020-04-07 | Disposition: A | Discharge status: Home / Self Care | Payer: 59 | Source: Ambulatory Visit | Attending: Hematology and Oncology | Admitting: Hematology and Oncology

## 2020-04-07 ENCOUNTER — Other Ambulatory Visit: Payer: Self-pay

## 2020-04-30 ENCOUNTER — Inpatient Hospital Stay: Payer: 59 | Attending: Hematology and Oncology

## 2020-04-30 ENCOUNTER — Other Ambulatory Visit: Payer: Self-pay

## 2020-04-30 DIAGNOSIS — D751 Secondary polycythemia: Secondary | ICD-10-CM | POA: Insufficient documentation

## 2020-04-30 DIAGNOSIS — E079 Disorder of thyroid, unspecified: Secondary | ICD-10-CM | POA: Insufficient documentation

## 2020-04-30 DIAGNOSIS — Z79899 Other long term (current) drug therapy: Secondary | ICD-10-CM | POA: Diagnosis not present

## 2020-04-30 DIAGNOSIS — Z803 Family history of malignant neoplasm of breast: Secondary | ICD-10-CM | POA: Insufficient documentation

## 2020-04-30 DIAGNOSIS — Z833 Family history of diabetes mellitus: Secondary | ICD-10-CM | POA: Insufficient documentation

## 2020-04-30 DIAGNOSIS — K8020 Calculus of gallbladder without cholecystitis without obstruction: Secondary | ICD-10-CM | POA: Diagnosis not present

## 2020-04-30 LAB — CBC WITH DIFFERENTIAL/PLATELET
Abs Immature Granulocytes: 0.02 10*3/uL (ref 0.00–0.07)
Basophils Absolute: 0 10*3/uL (ref 0.0–0.1)
Basophils Relative: 1 %
Eosinophils Absolute: 0.1 10*3/uL (ref 0.0–0.5)
Eosinophils Relative: 1 %
HCT: 48.9 % — ABNORMAL HIGH (ref 36.0–46.0)
Hemoglobin: 16.1 g/dL — ABNORMAL HIGH (ref 12.0–15.0)
Immature Granulocytes: 0 %
Lymphocytes Relative: 28 %
Lymphs Abs: 1.9 10*3/uL (ref 0.7–4.0)
MCH: 29.5 pg (ref 26.0–34.0)
MCHC: 32.9 g/dL (ref 30.0–36.0)
MCV: 89.7 fL (ref 80.0–100.0)
Monocytes Absolute: 0.5 10*3/uL (ref 0.1–1.0)
Monocytes Relative: 7 %
Neutro Abs: 4.4 10*3/uL (ref 1.7–7.7)
Neutrophils Relative %: 63 %
Platelets: 230 10*3/uL (ref 150–400)
RBC: 5.45 MIL/uL — ABNORMAL HIGH (ref 3.87–5.11)
RDW: 12.6 % (ref 11.5–15.5)
WBC: 6.9 10*3/uL (ref 4.0–10.5)
nRBC: 0 % (ref 0.0–0.2)

## 2020-04-30 LAB — BASIC METABOLIC PANEL
Anion gap: 8 (ref 5–15)
BUN: 16 mg/dL (ref 6–20)
CO2: 27 mmol/L (ref 22–32)
Calcium: 9.2 mg/dL (ref 8.9–10.3)
Chloride: 103 mmol/L (ref 98–111)
Creatinine, Ser: 0.88 mg/dL (ref 0.44–1.00)
GFR, Estimated: >60 mL/min (ref >60)
Glucose, Bld: 106 mg/dL — ABNORMAL HIGH (ref 70–99)
Potassium: 4.5 mmol/L (ref 3.5–5.1)
Sodium: 138 mmol/L (ref 135–145)

## 2020-05-01 NOTE — Progress Notes (Signed)
[facility]  [address] [place], [place] [address] Phone: [phone]  Fax: [phone]   Clinic Day: 05/05/20  Referring physician: Sallee Lange, *  Chief Complaint: Bailey Dennis is a 54 y.o. female with hereditary hemochromatosis who is seen for revew of interval labs and discussion regarding direction of therapy.   HPI: The patient was last seen in the oncology clinic on 03/31/2020. At that time, she denied any concerns.  Exam was stable. Hematocrit was 47.9, hemoglobin 16.1, platelets 231,000, WBC 8,500. Sodium was 132. Calcium was 8.6. Ferritin as 25 with an iron saturation of 25% and a TIBC 302. UA revealed trace hemoglobin, trace ketones, 0-5 RBCs/HPF, and many bacteria. AFP was 1.8. Hepatitis B core antibody and hepatitis C antibody were non reactive.  RUQ ultrasound on 04/07/2020 revealed a normal appearing liver. There was no focal liver lesion. There was cholelithiasis.  Labs on 02/29/2020 revealed a hematocrit of 48.9, hemoglobin 16.1, platelets 230,000, WBC 6,900. CMP was normal.  During the interim, she has felt good.  The patient does not think she is dehydrated; she always has something to drink. She does not think she has sleep apnea; she feels refreshed in the mornings. She denies any known heart or lung issues. She has never smoked and denies recent exposure to second-hand smoking. She does not have carbon monoxide monitors in her home, but does not feel any different when she leaves her house vs when she is inside. She does not give herself any injections.   Past Medical History:  Diagnosis Date  . Thyroid disease     Past Surgical History:  Procedure Laterality Date  . BREAST BIOPSY Left    neg    Family History  Problem Relation Age of Onset  . Breast cancer Mother 79  . Diabetes Father     Social History:  reports that she has never smoked. She has never used smokeless tobacco. She reports current  alcohol use. She reports that she does not use drugs. She has a couple of glasses of wine on the weekends. She denies tobacco use. She denies exposure to radiation or toxins. She has two children (18 and 19). 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 The patient is alone today.  Allergies: No Known Allergies  Current Medications: Current Outpatient Medications  Medication Sig Dispense Refill  . ferrous sulfate 325 (65 FE) MG tablet Take by mouth. (Patient not taking: Reported on 05/19/2020)    . Multiple Vitamin (MULTI-VITAMINS) TABS Take by mouth.    . levothyroxine (SYNTHROID) 200 MCG tablet Take 200 mcg by mouth daily.     No current facility-administered medications for this visit.    Review of Systems  Constitutional: Negative for chills, diaphoresis, fever, malaise/fatigue and weight loss (stable).       Feels "good".  HENT: Negative for congestion, ear discharge, ear pain, hearing loss, nosebleeds, sinus pain, sore throat and tinnitus.   Eyes: Negative for blurred vision.  Respiratory: Negative for cough, hemoptysis, sputum production and shortness of breath.   Cardiovascular: Negative for chest pain, palpitations and leg swelling.  Gastrointestinal: Negative for abdominal pain, blood in stool, constipation, diarrhea, heartburn, melena, nausea and vomiting.  Genitourinary: Negative for dysuria, frequency, hematuria and urgency.  Musculoskeletal: Negative for back pain, joint pain (knees), myalgias and neck pain.  Skin: Negative for itching and rash.  Neurological: Negative for dizziness, tingling, sensory change, weakness and headaches.  Endo/Heme/Allergies: Does not bruise/bleed easily.  Psychiatric/Behavioral:  Negative for depression and memory loss. The patient is not nervous/anxious and does not have insomnia.   All other systems reviewed and are negative.  Performance status (ECOG): 0  Vitals Blood pressure 112/63, pulse 95, temperature 97.8 F (36.6 C), temperature  source Tympanic, resp. rate 16, weight 217 lb 13 oz (98.8 kg), SpO2 98 %.   Physical Exam Vitals and nursing note reviewed.  Constitutional:      General: She is not in acute distress.    Appearance: She is not diaphoretic.  HENT:     Head:     Comments: Long blonde hair. Eyes:     General: No scleral icterus.    Conjunctiva/sclera: Conjunctivae normal.     Comments: Blue eyes.  Neurological:     Mental Status: She is alert and oriented to person, place, and time.  Psychiatric:        Behavior: Behavior normal.        Thought Content: Thought content normal.        Judgment: Judgment normal.    Office Visit on 05/05/2020  Component Date Value Ref Range Status  . Erythropoietin 05/05/2020 7.5  2.6 - 18.5 mIU/mL Final   Comment: (NOTE) Beckman Coulter UniCel DxI [place] obtained with different assay methods or kits cannot be used interchangeably. Results cannot be interpreted as absolute evidence of the presence or absence of malignant disease. Performed At: [facility] [place], [place] [id number] Rush Farmer MD [id number]   . JAK2 GenotypR 05/05/2020 Comment   Final   Comment: (NOTE) Result: NEGATIVE for the JAK2 V617F mutation. Interpretation:  The G to T nucleotide change encoding the V617F mutation was not detected.  This result does not rule out the presence of the JAK2 mutation at a level below the sensitivity of detection of this assay, or the presence of other mutations within JAK2 not detected by this assay.  This result does not rule out a diagnosis of polycythemia vera, essential thrombocythemia or idiopathic myelofibrosis as the V617F mutation is not detected in all patients with these disorders.   Marland Kitchen BACKGROUND: 05/05/2020 Comment   Final   Comment: (NOTE) JAK2 is a cytoplasmic tyrosine kinase with a key role in signal transduction from multiple hematopoietic growth factor receptors. A point mutation  within exon 14 of the JAK2 gene ([id number]) encoding a valine to phenylalanine substitution at position 617 of the JAK2 protein (V617F) has been identified in most patients with polycythemia vera, and in about half of those with either essential thrombocythemia or idiopathic myelofibrosis. The V617F has also been detected, although infrequently, in other myeloid disorders such as chronic myelomonocytic leukemia and chronic neutrophilic luekemia. V617F is an acquired mutation that alters a highly conserved valine present in the negative regulatory JH2 domain of the JAK2 protein and is predicted to dysregulate kinase activity. Methodology: Total genomic DNA was extracted and subjected to TaqMan real-time PCR amplification/detection. Two amplification products per sample were monitored by real-time PCR using primers/probes s                          pecific to JAK2 wild type (WT) and JAK2 mutant V617F. The ABI7900 Absolute Quantitation software will compare the patient specimen valuse to the standard curves and generate percent values for wild type and mutant type. In vitro studies have indicated that this assay has an analytical sensitivity of 1%. References: Baxter EJ, Scott Phineas Real, et al. Acquired  mutation of the tyrosine kinase JAK2 in human myeloproliferative disorders. Lancet. 2005 Mar 19-25; 365(9464):1054-1061. Alfonso Ramus Couedic JP. A unique clonal JAK2 mutation leading to constitutive signaling causes polycythaemia vera. Nature. 2005 Apr 28; 434(7037):1144-1148. Kralovics R, Passamonti F, Buser AS, et al. A gain-of-function mutation of JAK2 in myeloproliferative disorders. N Engl J Med. 2005 Apr 28; 352(17):1779-1790.   . Director Review, JAK2 05/05/2020 Comment   Final   Comment: (NOTE) Constance Goltz, PhD, [facility]               Director, [place] for [place] and [place], [place]               [phone] This test was developed and its performance characteristics determined by [facility]. It has not been cleared or approved by the Food and Drug Administration.   Marland Kitchen REFLEX: 05/05/2020 Comment   Final   Comment: (NOTE) Reflex to CALR Mutation Analysis, JAK2 Exon 12-15 Mutation Analysis, and MPL Mutation Analysis is indicated.   Marland Kitchen Extraction 05/05/2020 Completed   Corrected   Comment: (NOTE) Performed At: [employer] [facility] [address] [place], [place] [id number] Katina Degree MDPhD [id number] Performed At: [facility] [place], [place] [id number] Katina Degree MDPhD [id number]   . Carbon Monoxide, Blood 05/05/2020 2.0  0.0 - 3.6 % Final   Comment: (NOTE)                            Environmental Exposure:                             Nonsmokers           <3.7                             Smokers              <9.9                            Occupational Exposure:                             BEI                   3.5                                Detection Limit =  0.2 Performed At: [facility] [place], [place] [id number] Rush Farmer MD [id number]   . Color, Urine 05/05/2020 YELLOW  YELLOW Final  . APPearance 05/05/2020 CLEAR  CLEAR Final  . Specific Gravity, Urine 05/05/2020 1.015  1.005 - 1.030 Final  . pH 05/05/2020 7.0  5.0 - 8.0 Final  . Glucose, UA 05/05/2020 NEGATIVE  NEGATIVE mg/dL Final  . Hgb urine dipstick 05/05/2020 TRACE* NEGATIVE Final  . Bilirubin Urine 05/05/2020 NEGATIVE  NEGATIVE Final  . Ketones, ur 05/05/2020 NEGATIVE  NEGATIVE mg/dL Final  . Protein, ur 05/05/2020 NEGATIVE  NEGATIVE mg/dL Final  . Nitrite 05/05/2020 NEGATIVE  NEGATIVE Final  . Chalmers Guest 05/05/2020 NEGATIVE  NEGATIVE Final  . Squamous Epithelial / LPF 05/05/2020 0-5  0 - 5 Final  . WBC, UA 05/05/2020 0-5  0 - 5 WBC/hpf Final  . RBC / HPF 05/05/2020 6-10  0 - 5 RBC/hpf Final  . Bacteria, UA 05/05/2020  FEW* NONE SEEN Final   Performed at [facility], [address] [place], [place] [address]  . CALR Mutation Detection Result 05/05/2020 Comment   Final   Comment: (NOTE) NEGATIVE No insertions or deletions were detected within the analyzed region of the calreticulin (CALR) gene. A negative result does not entirely exclude the possibility of a clonal population carrying CALR gene mutations that are not covered by this assay. Results should be interpreted in conjunction with clinical and laboratory findings for the most accurate interpretation.   . Background: 05/05/2020 Comment   Final   Comment: (NOTE) The calcium-binding endoplasmic reticulin chaperone protein, calreticulin (CALR), is somatically mutated in approximately 70% of patients with JAK2-negative essential thrombocythemia (ET) and 60- 88% of patients with JAK2-negative primary myelofibrosis(PMF). Only a minority of patients (approximately 8%) with myelodysplasia have mutations in  CALR gene. CALR mutations are rarely detected in patients with de novo acute myeloid leukemia, chronic myelogenous leukemia, lymphoid leukemia, or solid tumors. CALR mutations are not detected in polycythemia and generally appear to be mutually exclusive with JAK2 mutations and MPL mutations. The majority of mutational changes involve a variety of insertion or deletion mutations in exon 9 of the calreticulin gene: approximately 53% of all CALR mutations are a 52 bp deletion (type-1) while the second most prevalent mutation (approximately 32%) contains a 5 bp insertion (type-2). Other mutations (non-type 1 or type 2) are seen                           in a small minority of cases. CALR mutations in PMF tend to be associated with a favorable prognosis compared to JAK2 V617F mutations, whereas primary myelofibrosis negative for CALR, JAK2 V617F and MPL mutations (so-called triple negative) is associated with a poor prognosis and  shorter survival. The detection of a CALR gene mutation aids in the specific diagnosis of a myeloproliferative neoplasm, and help distinguish this clonal disease from a benign reactive process.   . Methodology: 05/05/2020 Comment   Final   Comment: (NOTE) Genomic DNA was isolated from the provided specimen. Polymerase chain reaction (PCR) of exon 9 of the CALR gene was performed with specific fluorescent-labeled primers, and the PCR product was analyzed by capillary gel electrophoresis to determine the size of the PCR products. This PCR assay is capable of detecting a mutant cell population with a sensitivity of 5 mutant cells per 100 normal cells. A negative result does not exclude the presence of a myeloproliferative disorder or other neoplastic process. This test was developed and its performance characteristics determined by [facility]. It has not been cleared or approved by the Food and Drug Administration. The FDA has determined that such clearance or approval is not necessary.   . References: 05/05/2020 Comment   Final   Comment: (NOTE) 1. Klampfel, T. et al. (2013) Somatic mutations of calreticulin in   myeloproliferative neoplasms. New Engl. J. Med. 323:5573-2202. 2. Haynes Kerns et al. (2013) Somatic CALR mutations in   myeloproliferative neoplasms with nonmutated JAK2. New Engl. J.  Med. [phone].   Marland Kitchen Director Review 05/05/2020 Comment   Final   Comment: (NOTE) Loni Muse, PhD, [facility]    Director, [place] for [place] and Pathology    [address] [place], [place] [address]    [phone]   . JAK2 Exons 12-15 Mut Det PCR: 05/05/2020 Comment   Final   Comment: (NOTE) NEGATIVE JAK2 mutations were not detected in exons 12, 13, 14 and 15. This result does not rule out the presence of JAK2 mutation at a level below the detection sensitivity of this assay, the presence of other mutations outside the analyzed region of the JAK2 gene,  or the presence of a myeloproliferative or other neoplasm. Result must be correlated with other clinical data for the most accurate diagnosis.   Marland Kitchen BACKGROUND: 05/05/2020 Comment   Final   Comment: (NOTE) JAK2 V617F mutation is detected in patients with polycythemia vera (95%), essential thrombocythemia (50%) and primary myelofibrosis (50%). A small percentage of JAK2 mutation positive patients (3.3%) contain other non-V617F mutations within exons 12 to 15. The detection of a JAK2 gene mutation aids in the specific diagnosis of a myeloproliferative neoplasm, and help distinguish this clonal disease from a benign reactive process.   . Method 05/05/2020 Comment   Final   Comment: (NOTE) Total RNA was purified from the provided specimen. The JAK2 gene region covering exons 12 to 15 was subjected to reverse- transcription coupled PCR amplification, and bi-directional sequencing to identify sequence variations. This assay has a sensitivity to detect approximately 15% population of cells containing the JAK2 mutations in a background of non-mutant cells. This test was developed and its performance characteristics determined by [facility]. It has not been cleared or approved by the Food and Drug Administration.   . References 05/05/2020 Comment   Final   Comment: (NOTE) Algasham, N. et al. Detection of mutations in JAK2 exons 12-15 by Sanger sequencing. Int J Lab Hemato. 2015, 38:34-41. Joelene Millin al. Mutation profile of JAK2 transcripts in patients with chronic myeloproliferative neoplasias. J Mol Diagn. 2009, 11:49-53.   Marland Kitchen DIRECTOR REVIEW: 05/05/2020 Comment   Final   Comment: (NOTE) Loni Muse, PhD, [facility]    Director, [place] for [place] and Pathology    [address] [place], [place] [address]    [phone]   . MPL MUTATION ANALYSIS RESULT: 05/05/2020 Comment   Final   Comment: (NOTE) No MPL mutation was identified in the provided specimen of  this individual. Results should be interpreted in conjunction with clinical and other laboratory findings for the most accurate interpretation.   Marland Kitchen BACKGROUND: 05/05/2020 Comment   Final   Comment: (NOTE) MPL (myeloproliferative leukemia virus oncogene homology) belongs to the hematopoietin superfamily and enables its ligand thrombopoietin to facilitate both global hematopoiesis and megakaryocyte growth and differentiation. MPL W515 mutations are present in patients with primary myelofibrosis (PMF) and essential thrombocythemia (ET) at a frequency of approximately 5% and 1% respectively. The S505 mutation is detected in patients with hereditary thrombocythemia.   Marland Kitchen METHODOLOGY: 05/05/2020 Comment   Final   Comment: (NOTE) Genomic DNA was purified from the provided specimen. MPL gene region covering the S505N and W515L/K mutations were subjected to PCR amplification and bi-directional sequencing in duplicate to identify sequence variations. This assay has a sensitivity to detect approximately 20-25% population of cells containing the MPL mutations in a background of non-mutant cells. This assay will not detect the mutation below the sensitivity of this assay.  Molecular- based testing is highly accurate, but as in any laboratory test, rare diagnostic errors may occur.   Marland Kitchen REFERENCES: 05/05/2020 Comment   Final   Comment: (NOTE) 1. Pardanani AD, et al. (2006). MPL515 mutations in   myeloproliferative and other myeloid disorders: a study   of 1182 patients. Blood 413:2440-1027. 2. Andre Lefort and Levine RL. (2008). JAK2 and MPL   mutations in myeloproliferative neoplasms: discovery and   science. Leukemia 22:1813-1817. 3. Juline Patch, et al. (2009). Evidence for a founder effect   of the MPL-S505N mutation in eight [place] pedigrees with   hereditary thrombocythemia. Haematologica 94(10):1368-   2536.   Marland Kitchen DIRECTOR REVIEW: 05/05/2020 Comment   Final   Comment: (NOTE) Loni Muse,  PhD, [facility]    Director, [place] for [place] and [place], [place] [address]    [phone] This test was developed and its performance characteristics determined by [facility]. It has not been cleared or approved by the Food and Drug Administration.   . Extraction 05/05/2020 Comment   Final   Comment: (NOTE) This sample has been received and DNA extraction has been performed. Performed At: [facility] [facility] [address] [place], [place] [id number] Katina Degree MDPhD [id number] Performed At: [facility] [address] [place], [place] [id number] Katina Degree MDPhD [id number]     Assessment:  Bailey Dennis is a 54 y.o. female with hereditary hemochromatosis.  She is homozygous for C282Y.  She was tested after her mothers and sister were diagnosed with hemochromatosis.  Labs on 02/05/2020 revealed a hematocrit of 47.7, hemoglobin 15.6, platelets 201,000, WBC 7,200.  Ferritin was 27 with an iron saturation of 28% and TIBC of 331.1.  She has a history of anemia felt secondary to menorrhagia.  She is no longer on oral iron.  RUQ ultrasound on 04/07/2020 revealed a normal appearing liver. There was no focal liver lesion. There was cholelithiasis.  AFP was 1.8 on 03/31/2020.  She has erythrocytosis.  Hemoglobin was 16.1 on 03/31/2020 and 04/30/2020.  She received the COVID-19 vaccine on 08/27/2019 and 09/17/2019.  Symptomatically, she feels "good".  She denies dehydration, cardiopulmonary disease, smoking, and sleep apnea.  Exam is unremarkable.  Plan: 1.   Labs today:  carbon monoxide level, epo level, JAK2 with reflex. 2.   Urinalysis. 3.   Hereditary hemochromatosis   Patient is homozygous for C282Y.  Ferritin was 27 on 02/05/2020.  Ferritin goal is 50-100.  RUQ ultrasound on 04/07/2020 revealed no focal abnormalities.  AFP was 1.8 on 03/31/2020.  Continue to monitor. 4.   History of iron deficiency  Patient  notes heavy menses.  Patient post-menopausal x 14 months.  Ferritin was 27 (surprising low) on 02/05/2020.  Urinalysis on 03/31/2020 revealed trace hemoglobin and 0-5 RBCs /HPF.  She denies h/o reflux or gastritis.  Consider screening for GI losses (colonoscopy). 5.   Erythrocytosis  Hemoglobin 16.1 on 03/31/2020 and 04/30/2020.  Discuss primary vs secondary erythrocytosis.  Discuss volume depletion, high affinity hemoglobin, polycythemia rubra vera (PV), cardiopulomary disorders, CO exposure, elevated erythropoietin.  Discuss laboratory work-up. 6.   RTC in 2 weeks for MD assessment, review of work-up and discussion regarding direction of therapy.  I discussed the assessment and treatment plan with the patient.  The patient was provided an opportunity to ask questions and all were answered.  The patient agreed with the plan and demonstrated an understanding of the instructions.  The  patient was advised to call back if the symptoms worsen or if the condition fails to improve as anticipated.   Danitza Schoenfeldt C. Mike Gip, MD, PhD    05/05/2020, 3:13 PM  I, Mirian Mo Tufford, am acting as Education administrator for [employer]. Mike Gip, MD, PhD.  I, Erhard Senske C. Mike Gip, MD, have reviewed the above documentation for accuracy and completeness, and I agree with the above.

## 2020-05-05 ENCOUNTER — Encounter: Payer: Self-pay | Admitting: Hematology and Oncology

## 2020-05-05 ENCOUNTER — Inpatient Hospital Stay: Payer: 59

## 2020-05-05 ENCOUNTER — Other Ambulatory Visit: Payer: Self-pay

## 2020-05-05 ENCOUNTER — Inpatient Hospital Stay: Payer: 59 | Admitting: Hematology and Oncology

## 2020-05-05 DIAGNOSIS — D751 Secondary polycythemia: Secondary | ICD-10-CM | POA: Diagnosis not present

## 2020-05-05 LAB — URINALYSIS, COMPLETE (UACMP) WITH MICROSCOPIC
Bilirubin Urine: NEGATIVE
Glucose, UA: NEGATIVE mg/dL
Ketones, ur: NEGATIVE mg/dL
Leukocytes,Ua: NEGATIVE
Nitrite: NEGATIVE
Protein, ur: NEGATIVE mg/dL
Specific Gravity, Urine: 1.015 (ref 1.005–1.030)
pH: 7 (ref 5.0–8.0)

## 2020-05-06 LAB — ERYTHROPOIETIN: Erythropoietin: 7.5 m[IU]/mL (ref 2.6–18.5)

## 2020-05-08 LAB — CARBON MONOXIDE, BLOOD (PERFORMED AT REF LAB): Carbon Monoxide, Blood: 2 % (ref 0.0–3.6)

## 2020-05-18 LAB — CALR + JAK2 E12-15 + MPL (REFLEXED)

## 2020-05-18 LAB — JAK2 V617F, W REFLEX TO CALR/E12/MPL

## 2020-05-19 ENCOUNTER — Inpatient Hospital Stay: Payer: 59 | Admitting: Hematology and Oncology

## 2020-05-19 ENCOUNTER — Encounter: Payer: Self-pay | Admitting: Hematology and Oncology

## 2020-05-19 ENCOUNTER — Other Ambulatory Visit: Payer: Self-pay

## 2020-05-19 DIAGNOSIS — D751 Secondary polycythemia: Secondary | ICD-10-CM | POA: Insufficient documentation

## 2020-05-19 DIAGNOSIS — R3129 Other microscopic hematuria: Secondary | ICD-10-CM | POA: Insufficient documentation

## 2020-05-19 NOTE — Progress Notes (Signed)
[facility]  [address], [address] [place], [place] [address] Phone: [phone]  Fax: [phone]   Clinic Day: 05/19/20  Referring physician: Sallee Lange, *  Chief Complaint: Bailey Dennis is a 54 y.o. female with hereditary hemochromatosis and erythrocytosis who is seen for revew of interval labs and discussion regarding direction of therapy.   HPI: The patient was last seen in the oncology clinic on 05/05/2020. At that time, she felt good.  She denied dehydration, sleep apnea, heart or lung disease, smoking or second hand smoke exposure.  She did not have carbon monoxide monitors in her home.  Labs from 02/29/2020 revealed a hematocrit of 48.9, hemoglobin 16.1, platelets 230,000, WBC 6,900. CMP was normal.  We discussed an evaluation for erythrocytosis as her hemoglobin was > 16.0.  Normal studies on 05/05/2020 included:  epo (7.5), carbon monoxide (2.0%), JAK2 V617F, exon 12-15, CALR, and MPL.  Urinalysis revealed trace hemoglobin, 6-10 RBC/HPF, and few bacteria.  During the interim, she has felt good. She denies hematuria and any other symptoms. She went through menopause about 14 months ago.   Past Medical History:  Diagnosis Date  . Thyroid disease     Past Surgical History:  Procedure Laterality Date  . BREAST BIOPSY Left    neg    Family History  Problem Relation Age of Onset  . Breast cancer Mother 11  . Diabetes Father     Social History:  reports that she has never smoked. She has never used smokeless tobacco. She reports current alcohol use. She reports that she does not use drugs. She has a couple of glasses of wine on the weekends. She denies tobacco use. She denies exposure to radiation or toxins. She has two children (18 and 19). She works for a [employer] in [place]. The patient is alone today.  Allergies: No Known Allergies  Current Medications: Current Outpatient Medications  Medication Sig Dispense Refill  .  levothyroxine (SYNTHROID) 200 MCG tablet Take 200 mcg by mouth daily.    . Multiple Vitamin (MULTI-VITAMINS) TABS Take by mouth.    . ferrous sulfate 325 (65 FE) MG tablet Take by mouth. (Patient not taking: Reported on 05/19/2020)     No current facility-administered medications for this visit.    Review of Systems  Constitutional: Negative for chills, diaphoresis, fever, malaise/fatigue and weight loss (stable).       Feels "good."  HENT: Negative for congestion, ear discharge, ear pain, hearing loss, nosebleeds, sinus pain, sore throat and tinnitus.   Eyes: Negative for blurred vision.  Respiratory: Negative for cough, hemoptysis, sputum production and shortness of breath.   Cardiovascular: Negative for chest pain, palpitations and leg swelling.  Gastrointestinal: Negative for abdominal pain, blood in stool, constipation, diarrhea, heartburn, melena, nausea and vomiting.  Genitourinary: Negative for dysuria, frequency, hematuria and urgency.  Musculoskeletal: Negative for back pain, joint pain (knees), myalgias and neck pain.  Skin: Negative for itching and rash.  Neurological: Negative for dizziness, tingling, sensory change, weakness and headaches.  Endo/Heme/Allergies: Does not bruise/bleed easily.  Psychiatric/Behavioral: Negative for depression and memory loss. The patient is not nervous/anxious and does not have insomnia.   All other systems reviewed and are negative.  Performance status (ECOG): 0  Vitals Blood pressure (!) 119/51, pulse 72, temperature 97.8 F (36.6 C), resp. rate 20, weight 217 lb 13 oz (98.8 kg), SpO2 97 %.   Physical Exam Vitals and nursing note reviewed.  Constitutional:  General: She is not in acute distress.    Appearance: She is not diaphoretic.  HENT:     Head:     Comments: Long blonde hair. Eyes:     General: No scleral icterus.    Conjunctiva/sclera: Conjunctivae normal.     Comments: Blue eyes.  Neurological:     Mental Status: She  is alert and oriented to person, place, and time.  Psychiatric:        Behavior: Behavior normal.        Thought Content: Thought content normal.        Judgment: Judgment normal.    No visits with results within 3 Day(s) from this visit.  Latest known visit with results is:  Office Visit on 05/05/2020  Component Date Value Ref Range Status  . Erythropoietin 05/05/2020 7.5  2.6 - 18.5 mIU/mL Final   Comment: (NOTE) Beckman Coulter UniCel DxI Gordon obtained with different assay methods or kits cannot be used interchangeably. Results cannot be interpreted as absolute evidence of the presence or absence of malignant disease. Performed At: [facility] [place], [place] [id number] Rush Farmer MD [id number]   . JAK2 GenotypR 05/05/2020 Comment   Final   Comment: (NOTE) Result: NEGATIVE for the JAK2 V617F mutation. Interpretation:  The G to T nucleotide change encoding the V617F mutation was not detected.  This result does not rule out the presence of the JAK2 mutation at a level below the sensitivity of detection of this assay, or the presence of other mutations within JAK2 not detected by this assay.  This result does not rule out a diagnosis of polycythemia vera, essential thrombocythemia or idiopathic myelofibrosis as the V617F mutation is not detected in all patients with these disorders.   Marland Kitchen BACKGROUND: 05/05/2020 Comment   Final   Comment: (NOTE) JAK2 is a cytoplasmic tyrosine kinase with a key role in signal transduction from multiple hematopoietic growth factor receptors. A point mutation within exon 14 of the JAK2 gene [id number]) encoding a valine to phenylalanine substitution at position 617 of the JAK2 protein (V617F) has been identified in most patients with polycythemia vera, and in about half of those with either essential thrombocythemia or idiopathic myelofibrosis. The V617F has also been detected, although  infrequently, in other myeloid disorders such as chronic myelomonocytic leukemia and chronic neutrophilic luekemia. V617F is an acquired mutation that alters a highly conserved valine present in the negative regulatory JH2 domain of the JAK2 protein and is predicted to dysregulate kinase activity. Methodology: Total genomic DNA was extracted and subjected to TaqMan real-time PCR amplification/detection. Two amplification products per sample were monitored by real-time PCR using primers/probes s                          pecific to JAK2 wild type (WT) and JAK2 mutant V617F. The ABI7900 Absolute Quantitation software will compare the patient specimen valuse to the standard curves and generate percent values for wild type and mutant type. In vitro studies have indicated that this assay has an analytical sensitivity of 1%. References: Baxter EJ, Scott Phineas Real, et al. Acquired mutation of the tyrosine kinase JAK2 in human myeloproliferative disorders. Lancet. 2005 Mar 19-25; 365(9464):1054-1061. Alfonso Ramus Couedic JP. A unique clonal JAK2 mutation leading to constitutive signaling causes polycythaemia vera. Nature. 2005 Apr 28; 434(7037):1144-1148. Kralovics R, Passamonti F, Buser AS, et al. A gain-of-function mutation of JAK2 in myeloproliferative disorders. N  Engl J Med. 2005 Apr 28; 352(17):1779-1790.   . Director Review, JAK2 05/05/2020 Comment   Final   Comment: (NOTE) Constance Goltz, PhD, [facility]               Director, [place] for [place] and [place], [place]               [phone] This test was developed and its performance characteristics determined by [facility]. It has not been cleared or approved by the Food and Drug Administration.   Marland Kitchen REFLEX: 05/05/2020 Comment   Final   Comment: (NOTE) Reflex to CALR Mutation Analysis, JAK2 Exon 12-15 Mutation Analysis, and MPL  Mutation Analysis is indicated.   Marland Kitchen Extraction 05/05/2020 Completed   Corrected   Comment: (NOTE) Performed At: [facility] [facility] [address] [place], [place] [id number] Katina Degree MDPhD [id number] Performed At: [facility] [place], [place] [id number] Katina Degree MDPhD [id number]   . Carbon Monoxide, Blood 05/05/2020 2.0  0.0 - 3.6 % Final   Comment: (NOTE)                            Environmental Exposure:                             Nonsmokers           <3.7                             Smokers              <9.9                            Occupational Exposure:                             BEI                   3.5                                Detection Limit =  0.2 Performed At: [facility] [place], [place] [id number] Rush Farmer MD [id number]   . Color, Urine 05/05/2020 YELLOW  YELLOW Final  . APPearance 05/05/2020 CLEAR  CLEAR Final  . Specific Gravity, Urine 05/05/2020 1.015  1.005 - 1.030 Final  . pH 05/05/2020 7.0  5.0 - 8.0 Final  . Glucose, UA 05/05/2020 NEGATIVE  NEGATIVE mg/dL Final  . Hgb urine dipstick 05/05/2020 TRACE* NEGATIVE Final  . Bilirubin Urine 05/05/2020 NEGATIVE  NEGATIVE Final  . Ketones, ur 05/05/2020 NEGATIVE  NEGATIVE mg/dL Final  . Protein, ur 05/05/2020 NEGATIVE  NEGATIVE mg/dL Final  . Nitrite 05/05/2020 NEGATIVE  NEGATIVE Final  . Chalmers Guest 05/05/2020 NEGATIVE  NEGATIVE Final  . Squamous Epithelial / LPF 05/05/2020 0-5  0 - 5 Final  . WBC, UA 05/05/2020 0-5  0 - 5 WBC/hpf Final  .  RBC / HPF 05/05/2020 6-10  0 - 5 RBC/hpf Final  . Bacteria, UA 05/05/2020 FEW* NONE SEEN Final   Performed at [facility], [address], [place], [place] [address]  . CALR Mutation Detection Result 05/05/2020 Comment   Final   Comment: (NOTE) NEGATIVE No insertions or deletions were detected within the analyzed region of the calreticulin (CALR) gene. A negative result does not entirely  exclude the possibility of a clonal population carrying CALR gene mutations that are not covered by this assay. Results should be interpreted in conjunction with clinical and laboratory findings for the most accurate interpretation.   . Background: 05/05/2020 Comment   Final   Comment: (NOTE) The calcium-binding endoplasmic reticulin chaperone protein, calreticulin (CALR), is somatically mutated in approximately 70% of patients with JAK2-negative essential thrombocythemia (ET) and 60- 88% of patients with JAK2-negative primary myelofibrosis(PMF). Only a minority of patients (approximately 8%) with myelodysplasia have mutations in  CALR gene. CALR mutations are rarely detected in patients with de novo acute myeloid leukemia, chronic myelogenous leukemia, lymphoid leukemia, or solid tumors. CALR mutations are not detected in polycythemia and generally appear to be mutually exclusive with JAK2 mutations and MPL mutations. The majority of mutational changes involve a variety of insertion or deletion mutations in exon 9 of the calreticulin gene: approximately 53% of all CALR mutations are a 52 bp deletion (type-1) while the second most prevalent mutation (approximately 32%) contains a 5 bp insertion (type-2). Other mutations (non-type 1 or type 2) are seen                           in a small minority of cases. CALR mutations in PMF tend to be associated with a favorable prognosis compared to JAK2 V617F mutations, whereas primary myelofibrosis negative for CALR, JAK2 V617F and MPL mutations (so-called triple negative) is associated with a poor prognosis and shorter survival. The detection of a CALR gene mutation aids in the specific diagnosis of a myeloproliferative neoplasm, and help distinguish this clonal disease from a benign reactive process.   . Methodology: 05/05/2020 Comment   Final   Comment: (NOTE) Genomic DNA was isolated from the provided specimen. Polymerase chain reaction  (PCR) of exon 9 of the CALR gene was performed with specific fluorescent-labeled primers, and the PCR product was analyzed by capillary gel electrophoresis to determine the size of the PCR products. This PCR assay is capable of detecting a mutant cell population with a sensitivity of 5 mutant cells per 100 normal cells. A negative result does not exclude the presence of a myeloproliferative disorder or other neoplastic process. This test was developed and its performance characteristics determined by [facility]. It has not been cleared or approved by the Food and Drug Administration. The FDA has determined that such clearance or approval is not necessary.   . References: 05/05/2020 Comment   Final   Comment: (NOTE) 1. Klampfel, T. et al. (2013) Somatic mutations of calreticulin in   myeloproliferative neoplasms. New Engl. J. Med. [id number]. 2. Haynes Kerns et al. (2013) Somatic CALR mutations in   myeloproliferative neoplasms with nonmutated JAK2. New Engl. J.   Med. [phone].   Marland Kitchen Director Review 05/05/2020 Comment   Final   Comment: (NOTE) Loni Muse, PhD, [facility]    Director, [place] for [place] and Pathology    [address] [place],  [address]    [phone]   . JAK2 Exons 12-15  Mut Det PCR: 05/05/2020 Comment   Final   Comment: (NOTE) NEGATIVE JAK2 mutations were not detected in exons 12, 13, 14 and 15. This result does not rule out the presence of JAK2 mutation at a level below the detection sensitivity of this assay, the presence of other mutations outside the analyzed region of the JAK2 gene, or the presence of a myeloproliferative or other neoplasm. Result must be correlated with other clinical data for the most accurate diagnosis.   Marland Kitchen BACKGROUND: 05/05/2020 Comment   Final   Comment: (NOTE) JAK2 V617F mutation is detected in patients with polycythemia vera (95%), essential thrombocythemia (50%) and primary  myelofibrosis (50%). A small percentage of JAK2 mutation positive patients (3.3%) contain other non-V617F mutations within exons 12 to 15. The detection of a JAK2 gene mutation aids in the specific diagnosis of a myeloproliferative neoplasm, and help distinguish this clonal disease from a benign reactive process.   . Method 05/05/2020 Comment   Final   Comment: (NOTE) Total RNA was purified from the provided specimen. The JAK2 gene region covering exons 12 to 15 was subjected to reverse- transcription coupled PCR amplification, and bi-directional sequencing to identify sequence variations. This assay has a sensitivity to detect approximately 15% population of cells containing the JAK2 mutations in a background of non-mutant cells. This test was developed and its performance characteristics determined by [facility]. It has not been cleared or approved by the Food and Drug Administration.   . References 05/05/2020 Comment   Final   Comment: (NOTE) Algasham, N. et al. Detection of mutations in JAK2 exons 12-15 by Sanger sequencing. Int J Lab Hemato. 2015, 38:34-41. Joelene Millin al. Mutation profile of JAK2 transcripts in patients with chronic myeloproliferative neoplasias. J Mol Diagn. 2009, 11:49-53.   Marland Kitchen DIRECTOR REVIEW: 05/05/2020 Comment   Final   Comment: (NOTE) Loni Muse, PhD, [facility]    Director, [place] for [place] and Pathology    Research [place], [place] [address]    [phone]   . MPL MUTATION ANALYSIS RESULT: 05/05/2020 Comment   Final   Comment: (NOTE) No MPL mutation was identified in the provided specimen of this individual. Results should be interpreted in conjunction with clinical and other laboratory findings for the most accurate interpretation.   Marland Kitchen BACKGROUND: 05/05/2020 Comment   Final   Comment: (NOTE) MPL (myeloproliferative leukemia virus oncogene homology) belongs to the hematopoietin superfamily and enables its ligand  thrombopoietin to facilitate both global hematopoiesis and megakaryocyte growth and differentiation. MPL W515 mutations are present in patients with primary myelofibrosis (PMF) and essential thrombocythemia (ET) at a frequency of approximately 5% and 1% respectively. The S505 mutation is detected in patients with hereditary thrombocythemia.   Marland Kitchen METHODOLOGY: 05/05/2020 Comment   Final   Comment: (NOTE) Genomic DNA was purified from the provided specimen. MPL gene region covering the S505N and W515L/K mutations were subjected to PCR amplification and bi-directional sequencing in duplicate to identify sequence variations. This assay has a sensitivity to detect approximately 20-25% population of cells containing the MPL mutations in a background of non-mutant cells. This assay will not detect the mutation below the sensitivity of this assay. Molecular- based testing is highly accurate, but as in any laboratory test, rare diagnostic errors may occur.   Marland Kitchen REFERENCES: 05/05/2020 Comment   Final   Comment: (NOTE) 1. Pardanani AD, et al. (2006). MPL515 mutations in   myeloproliferative and other myeloid disorders: a study   of 1182 patients. Blood  [phone]. Shively (2008). JAK2 and MPL   mutations in myeloproliferative neoplasms: discovery and   science. Leukemia 22:1813-1817. 3. Juline Patch, et al. (2009). Evidence for a founder effect   of the MPL-S505N mutation in eight [place] pedigrees with   hereditary thrombocythemia. Haematologica 94(10):1368-   3810.   Marland Kitchen DIRECTOR REVIEW: 05/05/2020 Comment   Final   Comment: (NOTE) Loni Muse, PhD, [facility]    Director, [place] for [place] and [place], [place] [address]    [phone] This test was developed and its performance characteristics determined by [facility]. It has not been cleared or approved by the Food and Drug Administration.   . Extraction  05/05/2020 Comment   Final   Comment: (NOTE) This sample has been received and DNA extraction has been performed. Performed At: [facility] [address] [place], [place] [id number] Katina Degree MDPhD [id number] Performed At: [facility] [address] [place], [place] [id number] Katina Degree MDPhD [id number]     Assessment:  Bailey Dennis is a 54 y.o. female with hereditary hemochromatosis.  She is homozygous for C282Y.  She was tested after her mothers and sister were diagnosed with hemochromatosis.  Labs on 02/05/2020 revealed a hematocrit of 47.7, hemoglobin 15.6, platelets 201,000, WBC 7,200. Ferritin was 27 with an iron saturation of 28% and TIBC of 331.1.  She has a history of anemia felt secondary to menorrhagia.  She is no longer on oral iron.  RUQ ultrasound on 04/07/2020 revealed a normal appearing liver. There was no focal liver lesion. There was cholelithiasis.  She has a history of erythrocytosis.  Hemoglobin was 16.1 on 03/31/2020 and 04/30/2020. Normal studies on 05/05/2020 included:  epo (7.5), carbon monoxide (2.0%), JAK2 V617F, exon 12-15, CALR, and MPL.  Urinalysis revealed trace hemoglobin, 6-10 RBC/HPF, and few bacteria.  She received the COVID-19 vaccine on 08/27/2019 and 09/17/2019.  Symptomatically, she feels "good".  She denies any hematuria.  She has had no menses x 14 months.  Plan: 1.   Review labs from 05/05/2020. 2.   Hereditary hemochromatosis   Patient is homozygous for C282Y.  She has been postmenopausal for 14 months.    Ferritin has not been elevated.    Ferritin goal is 50-100.  RUQ ultrasound and AFP every 6 months for [facility] surveillance.  Continue to monitor. 3.   History of iron deficiency  Patient notes heavy menses.  Patient post-menopausal x 14 months.  Ferritin was 27 (surprising low) on 02/05/2020.  Urinalysis confirmed microscopic hematuria.  Patient denies any bleeding.  Encourage colonoscopy as patient greater than  50.. 4.   Erythrocytosis  Hemoglobin 16.1 on 03/31/2020 and 04/30/2020.  Review work-up.  No evidence of a myeloproliferative disorder or secondary cause. 5.   Microscopic hematuria  Urinalysis on 05/05/2020 revealed trace hemoglobin and 6-10 RBC/HPF.  Discuss plan for renal ultrasound.  Discuss urology referral. 6.   Renal ultrasound on 05/22/2020. 7.   Urology referral. 8.   RTC as previously scheduled.  I discussed the assessment and treatment plan with the patient.  The patient was provided an opportunity to ask questions and all were answered.  The patient agreed with the plan and demonstrated an understanding of the instructions.  The patient was advised to call back if the symptoms worsen or if the condition fails to improve as anticipated.   Cecilia Nishikawa C. Mike Gip, MD, PhD  05/19/2020, 1:19 PM  I, Mirian Mo Tufford, am acting as Education administrator for [employer]. Mike Gip, MD, PhD.  I, Katharyn Schauer C. Mike Gip, MD, have reviewed the above documentation for accuracy and completeness, and I agree with the above.

## 2020-05-22 ENCOUNTER — Ambulatory Visit
Admission: RE | Admit: 2020-05-22 | Discharge: 2020-05-22 | Disposition: A | Discharge status: Home / Self Care | Payer: 59 | Source: Ambulatory Visit | Attending: Hematology and Oncology | Admitting: Hematology and Oncology

## 2020-05-22 ENCOUNTER — Other Ambulatory Visit: Payer: Self-pay

## 2020-05-22 DIAGNOSIS — R3129 Other microscopic hematuria: Secondary | ICD-10-CM | POA: Diagnosis not present

## 2020-05-27 ENCOUNTER — Other Ambulatory Visit: Payer: Self-pay

## 2020-05-27 DIAGNOSIS — R3129 Other microscopic hematuria: Secondary | ICD-10-CM

## 2020-05-29 ENCOUNTER — Other Ambulatory Visit: Payer: 59

## 2020-05-29 DIAGNOSIS — Z20822 Contact with and (suspected) exposure to covid-19: Secondary | ICD-10-CM

## 2020-05-30 ENCOUNTER — Ambulatory Visit: Payer: Self-pay | Admitting: Urology

## 2020-06-02 LAB — NOVEL CORONAVIRUS, NAA: SARS-CoV-2, NAA: DETECTED — AB

## 2020-06-13 ENCOUNTER — Other Ambulatory Visit: Payer: Self-pay

## 2020-06-13 ENCOUNTER — Ambulatory Visit (INDEPENDENT_AMBULATORY_CARE_PROVIDER_SITE_OTHER): Payer: 59 | Admitting: Urology

## 2020-06-13 ENCOUNTER — Other Ambulatory Visit
Admission: RE | Admit: 2020-06-13 | Discharge: 2020-06-13 | Disposition: A | Discharge status: Home / Self Care | Payer: 59 | Attending: Urology | Admitting: Urology

## 2020-06-13 ENCOUNTER — Ambulatory Visit: Payer: Self-pay | Admitting: Urology

## 2020-06-13 VITALS — BP 123/78 | HR 72 | Ht 67.0 in | Wt 212.0 lb

## 2020-06-13 DIAGNOSIS — N261 Atrophy of kidney (terminal): Secondary | ICD-10-CM | POA: Diagnosis not present

## 2020-06-13 DIAGNOSIS — R3129 Other microscopic hematuria: Secondary | ICD-10-CM

## 2020-06-13 LAB — URINALYSIS, COMPLETE (UACMP) WITH MICROSCOPIC
Bilirubin Urine: NEGATIVE
Glucose, UA: NEGATIVE mg/dL
Hgb urine dipstick: NEGATIVE
Ketones, ur: NEGATIVE mg/dL
Leukocytes,Ua: NEGATIVE
Nitrite: NEGATIVE
Protein, ur: NEGATIVE mg/dL
Specific Gravity, Urine: 1.02 (ref 1.005–1.030)
pH: 7 (ref 5.0–8.0)

## 2020-06-13 NOTE — Progress Notes (Signed)
06/13/2020 1:07 PM   Lattie Haw Johny Shock Jun 14, 1965 [id number]  Referring provider: Lequita Asal, MD [place],  [place] [address]  Chief Complaint  Patient presents with   Hematuria    HPI: 55 year old female who presents today for further evaluation of incidental microscopic hematuria  She underwent a routine urinalysis which revealed 6-10 red blood cells per high-power field and otherwise negative urine.  She had a renal ultrasound ordered by Dr. Mike Gip, her oncologist which showed mild renal cortical thinning on the right but no hydronephrosis or any other upper tract pathology.  Urinalysis today is negative.  Never smoker or exposure to secondhand smoking.  No history of stones or any other GU pathology.   PMH: Past Medical History:  Diagnosis Date   Thyroid disease     Surgical History: Past Surgical History:  Procedure Laterality Date   BREAST BIOPSY Left    neg    Home Medications:  Allergies as of 06/13/2020   No Known Allergies     Medication List       Accurate as of June 13, 2020  1:07 PM. If you have any questions, ask your nurse or doctor.        STOP taking these medications   ferrous sulfate 325 (65 FE) MG tablet Stopped by: Hollice Espy, MD     TAKE these medications   levothyroxine 200 MCG tablet Commonly known as: SYNTHROID Take 200 mcg by mouth daily.   Multi-Vitamins Tabs Take by mouth.       Allergies: No Known Allergies  Family History: Family History  Problem Relation Age of Onset   Breast cancer Mother 63   Diabetes Father     Social History:  reports that she has never smoked. She has never used smokeless tobacco. She reports current alcohol use. She reports that she does not use drugs.   Physical Exam: BP 123/78    Pulse 72    Ht 5\' 7"  (1.702 m)    Wt 212 lb (96.2 kg)    BMI 33.20 kg/m   Constitutional:  Alert and oriented, No acute distress. HEENT: [place] AT, moist mucus  membranes.  Trachea midline, no masses. Cardiovascular: No clubbing, cyanosis, or edema. Respiratory: Normal respiratory effort, no increased work of breathing. Skin: No rashes, bruises or suspicious lesions. Neurologic: Grossly intact, no focal deficits, moving all 4 extremities. Psychiatric: Normal mood and affect.  Laboratory Data: Lab Results  Component Value Date   WBC 6.9 04/30/2020   HGB 16.1 (H) 04/30/2020   HCT 48.9 (H) 04/30/2020   MCV 89.7 04/30/2020   PLT 230 04/30/2020    Lab Results  Component Value Date   CREATININE 0.88 04/30/2020   Urinalysis    Component Value Date/Time   COLORURINE YELLOW 06/13/2020 1121   APPEARANCEUR CLEAR 06/13/2020 1121   LABSPEC 1.020 06/13/2020 1121   PHURINE 7.0 06/13/2020 1121   GLUCOSEU NEGATIVE 06/13/2020 1121   HGBUR NEGATIVE 06/13/2020 1121   BILIRUBINUR NEGATIVE 06/13/2020 1121   KETONESUR NEGATIVE 06/13/2020 1121   PROTEINUR NEGATIVE 06/13/2020 1121   NITRITE NEGATIVE 06/13/2020 1121   LEUKOCYTESUR NEGATIVE 06/13/2020 1121    Lab Results  Component Value Date   BACTERIA MANY (A) 06/13/2020    Pertinent Imaging:  US RENAL  Narrative CLINICAL DATA:  Microscopic hematuria.  EXAM: RENAL / URINARY TRACT ULTRASOUND COMPLETE  COMPARISON:  None.  FINDINGS: Right Kidney:  Renal measurements: 10.0 x 4.7 x 5.3 cm = volume: 130 mL. Mild  renal cortical thinning but normal echogenicity. No focal renal lesions or hydronephrosis. No obvious renal calculi.  Left Kidney:  Renal measurements: 10.0 x 6.3 x 6.0 cm = volume: 198.2 mL. Normal renal cortical thickness and echogenicity without focal lesions. No hydronephrosis. No definite renal calculi. Suspect vascular calcifications.  Bladder:  Normal.  Bilateral ureteral jets noted.  Other:  None.  IMPRESSION: 1. Mild renal cortical thinning of the right kidney. 2. No renal lesions or hydronephrosis.   Electronically Signed By: Marijo Sanes M.D. On:  05/22/2020 15:24   Assessment & Plan:    1. Microscopic hematuria We discussed the differential diagnosis for microscopic hematuria including nephrolithiasis, renal or upper tract tumors, bladder stones, UTIs, or bladder tumors as well as undetermined etiologies. Per AUA guidelines, I did recommend complete microscopic hematuria Evaluation.  Given the relatively low volume of microscopic blood, her age as well as her lack of risk factors, she falls into the low risk category.  As such, upper tract imaging in the form of renal ultrasound is sufficient.  I have recommended a cystoscopy to complete her work-up.  We discussed this involves insertion of a small camera through the urethra into the bladder to inspect the bladder for any pathology.  We discussed the risk including discomforting infection.  All questions answered.  She is agreeable to proceed this.   - BLADDER SCAN AMB NON-IMAGING  2. Right renal atrophy Incidental right renal atrophy on renal ultrasound which was personally reviewed today  We discussed this may be a sequela of previous infection, congenital or just asymmetric age-related atrophy.  Overall renal function is normal.  No further work-up needed at this time and the options of obstruction.  Follow-up cystoscopy  Hollice Espy, MD  [facility] [address] [place],  [address] [phone]

## 2020-06-27 ENCOUNTER — Other Ambulatory Visit: Payer: Self-pay

## 2020-06-27 ENCOUNTER — Ambulatory Visit: Payer: 59 | Admitting: Urology

## 2020-06-27 ENCOUNTER — Encounter: Payer: Self-pay | Admitting: Urology

## 2020-06-27 VITALS — BP 109/73 | HR 89 | Ht 67.0 in | Wt 212.0 lb

## 2020-06-27 DIAGNOSIS — R3129 Other microscopic hematuria: Secondary | ICD-10-CM

## 2020-06-27 NOTE — Progress Notes (Signed)
   06/27/20  CC:  Chief Complaint  Patient presents with  . Cysto    HPI: 55 year old female with microscopic hematuria who presents today for cystoscopic evaluation.  Please see previous notes for details.  She is undergone a renal ultrasound which showed incidental cortical thinning on the right but otherwise unremarkable.  She has no other risk factors.  Blood pressure 109/73, pulse 89, height 5\' 7"  (1.702 m), weight 212 lb (96.2 kg). NED. A&Ox3.   No respiratory distress   Abd soft, NT, ND Normal external genitalia with patent urethral meatus  Cystoscopy Procedure Note  Patient identification was confirmed, informed consent was obtained, and patient was prepped using Betadine solution.  Lidocaine jelly was administered per urethral meatus.    Procedure: - Flexible cystoscope introduced, without any difficulty.   - Thorough search of the bladder revealed:    normal urethral meatus    normal urothelium    no stones    no ulcers     no tumors    no urethral polyps    no trabeculation  - Ureteral orifices were normal in position and appearance.  Post-Procedure: - Patient tolerated the procedure well  Assessment/ Plan:  1. Microscopic hematuria Cystoscopy today is unremarkable  No additional upper tract imaging needed at this point time based on her stratification  I have advised her if she continues to have persistent microscopic hematuria, would recommend reevaluation in 2 to 3 years.  We will forward to PCP.  History of gross hematuria has any other change in her urinary symptoms, she should return sooner.    Hollice Espy, MD

## 2020-06-30 ENCOUNTER — Ambulatory Visit: Payer: 59 | Admitting: Hematology and Oncology

## 2020-06-30 ENCOUNTER — Other Ambulatory Visit: Payer: 59

## 2020-07-10 ENCOUNTER — Other Ambulatory Visit: Payer: Self-pay

## 2020-07-14 ENCOUNTER — Other Ambulatory Visit: Payer: Self-pay

## 2020-07-14 ENCOUNTER — Encounter: Payer: Self-pay | Admitting: Hematology and Oncology

## 2020-07-14 ENCOUNTER — Inpatient Hospital Stay: Payer: 59 | Attending: Hematology and Oncology

## 2020-07-14 ENCOUNTER — Inpatient Hospital Stay: Payer: 59 | Admitting: Hematology and Oncology

## 2020-07-14 DIAGNOSIS — R3129 Other microscopic hematuria: Secondary | ICD-10-CM | POA: Insufficient documentation

## 2020-07-14 DIAGNOSIS — Z833 Family history of diabetes mellitus: Secondary | ICD-10-CM | POA: Insufficient documentation

## 2020-07-14 DIAGNOSIS — Z8616 Personal history of COVID-19: Secondary | ICD-10-CM | POA: Diagnosis not present

## 2020-07-14 DIAGNOSIS — K8020 Calculus of gallbladder without cholecystitis without obstruction: Secondary | ICD-10-CM | POA: Diagnosis not present

## 2020-07-14 DIAGNOSIS — D751 Secondary polycythemia: Secondary | ICD-10-CM | POA: Diagnosis present

## 2020-07-14 DIAGNOSIS — Z803 Family history of malignant neoplasm of breast: Secondary | ICD-10-CM | POA: Insufficient documentation

## 2020-07-14 DIAGNOSIS — Z79899 Other long term (current) drug therapy: Secondary | ICD-10-CM | POA: Diagnosis not present

## 2020-07-14 DIAGNOSIS — Z1231 Encounter for screening mammogram for malignant neoplasm of breast: Secondary | ICD-10-CM

## 2020-07-14 LAB — CBC
HCT: 48.8 % — ABNORMAL HIGH (ref 36.0–46.0)
Hemoglobin: 16 g/dL — ABNORMAL HIGH (ref 12.0–15.0)
MCH: 29.3 pg (ref 26.0–34.0)
MCHC: 32.8 g/dL (ref 30.0–36.0)
MCV: 89.4 fL (ref 80.0–100.0)
Platelets: 237 10*3/uL (ref 150–400)
RBC: 5.46 MIL/uL — ABNORMAL HIGH (ref 3.87–5.11)
RDW: 13.1 % (ref 11.5–15.5)
WBC: 5.6 10*3/uL (ref 4.0–10.5)
nRBC: 0 % (ref 0.0–0.2)

## 2020-07-14 LAB — FERRITIN: Ferritin: 27 ng/mL (ref 11–307)

## 2020-07-14 NOTE — Progress Notes (Signed)
[facility]  [address] [place], [place] [address] Phone: [phone]  Fax: [phone]   Clinic Day: 07/14/20  Referring physician: Sallee Lange, *  Chief Complaint: Bailey Dennis is a 56 y.o. female with hereditary hemochromatosis and erythrocytosis who is seen for 2 month assessment.   HPI: The patient was last seen in the oncology clinic on 05/19/2020. At that time,  she felt "good".  She denied any hematuria.  She had no menses x 14 months. She denied dehydration, sleep apnea, heart or lung disease, smoking or second hand smoke exposure.  She did not have carbon monoxide monitors in her home.  Labs from 04/30/2020 revealed a hematocrit of 48.9, hemoglobin 16.1, platelets 230,000, WBC 6,900.   Normal studies on 05/05/2020 included:  epo (7.5), carbon monoxide (2.0%), JAK2 V617F, exon 12-15, CALR, and MPL.  Urinalysis revealed trace hemoglobin, 6-10 RBC/HPF, and few bacteria.  Renal ultrasound revealed mild renal cortical thinning of the right kidney.  There were no renal lesions or hydronephrosis.  We discussed referral to urology.  She saw Dr Hollice Espy on 06/13/2020.  Cystoscopy on 06/27/2020 was unremarkable.  No additional upper tract imaging was needed based on her risk stratification.  She was advised if she continues to have persistent microscopic hematuria, would recommend reevaluation in 2 to 3 years.  During the interim, she has been "fine." She denies dizziness and lightheadedness. She has lost 8 lbs. She does not have any joint pain in her knees anymore because she is not standing for long periods of time. She eats meat regularly, though she notes she has been eating more fish and chicken lately rather than red meat.  The patient had a mild case of COVID-19 during the interim. She had flu-like and sinus symptoms. She had a mild fever one day but did not require any medication. Her husband also had it and ended up in the  hospital for 10 days. He is still on oxygen.  She had a colonoscopy on 07/2017 (cannot access results). Per patient, she had a few polyps and has to have colonoscopies every 5 years.   Past Medical History:  Diagnosis Date  . Thyroid disease     Past Surgical History:  Procedure Laterality Date  . BREAST BIOPSY Left    neg    Family History  Problem Relation Age of Onset  . Breast cancer Mother 77  . Diabetes Father     Social History:  reports that she has never smoked. She has never used smokeless tobacco. She reports current alcohol use. She reports that she does not use drugs. She has a couple of glasses of wine on the weekends. She denies tobacco use. She denies exposure to radiation or toxins. She has two children (18 and 19). She works for a [employer] in [place]. The patient is alone today.  Allergies: No Known Allergies  Current Medications: Current Outpatient Medications  Medication Sig Dispense Refill  . levothyroxine (SYNTHROID) 200 MCG tablet Take 200 mcg by mouth daily.    . Multiple Vitamin (MULTI-VITAMINS) TABS Take by mouth.     No current facility-administered medications for this visit.    Review of Systems  Constitutional: Positive for weight loss (8 lbs). Negative for chills, diaphoresis, fever and malaise/fatigue.       Feels "good."  HENT: Negative for congestion, ear discharge, ear pain, hearing loss, nosebleeds, sinus pain, sore throat and tinnitus.   Eyes: Negative for blurred vision.  Respiratory: Negative for cough, hemoptysis, sputum production and shortness of breath.        COVID-19 infection in the interim  Cardiovascular: Negative for chest pain, palpitations and leg swelling.  Gastrointestinal: Negative for abdominal pain, blood in stool, constipation, diarrhea, heartburn, melena, nausea and vomiting.  Genitourinary: Negative for dysuria, frequency, hematuria and urgency.  Musculoskeletal: Negative for back pain, joint pain, myalgias  and neck pain.  Skin: Negative for itching and rash.  Neurological: Negative for dizziness, tingling, sensory change, weakness and headaches.  Endo/Heme/Allergies: Does not bruise/bleed easily.  Psychiatric/Behavioral: Negative for depression and memory loss. The patient is not nervous/anxious and does not have insomnia.   All other systems reviewed and are negative.  Performance status (ECOG): 0  Vitals Blood pressure 115/75, pulse (!) 59, temperature 98.2 F (36.8 C), temperature source Tympanic, resp. rate 20, height 5\' 7"  (1.702 m), weight 209 lb 7 oz (95 kg).   Physical Exam Vitals and nursing note reviewed.  Constitutional:      General: She is not in acute distress.    Appearance: She is not diaphoretic.  HENT:     Head: Normocephalic and atraumatic.     Comments: Long blonde hair.    Mouth/Throat:     Mouth: Mucous membranes are moist.     Pharynx: Oropharynx is clear.  Eyes:     General: No scleral icterus.    Extraocular Movements: Extraocular movements intact.     Conjunctiva/sclera: Conjunctivae normal.     Pupils: Pupils are equal, round, and reactive to light.     Comments: Blue eyes.  Cardiovascular:     Rate and Rhythm: Normal rate and regular rhythm.     Heart sounds: Normal heart sounds. No murmur heard.   Pulmonary:     Effort: Pulmonary effort is normal. No respiratory distress.     Breath sounds: Normal breath sounds. No wheezing or rales.  Chest:     Chest wall: No tenderness.  Breasts:     Right: No axillary adenopathy or supraclavicular adenopathy.     Left: No axillary adenopathy or supraclavicular adenopathy.    Abdominal:     General: Bowel sounds are normal. There is no distension.     Palpations: Abdomen is soft. There is no mass.     Tenderness: There is no abdominal tenderness. There is no guarding or rebound.  Musculoskeletal:        General: No swelling or tenderness. Normal range of motion.     Cervical back: Normal range of motion  and neck supple.  Lymphadenopathy:     Head:     Right side of head: No preauricular, posterior auricular or occipital adenopathy.     Left side of head: No preauricular, posterior auricular or occipital adenopathy.     Cervical: No cervical adenopathy.     Upper Body:     Right upper body: No supraclavicular or axillary adenopathy.     Left upper body: No supraclavicular or axillary adenopathy.     Lower Body: No right inguinal adenopathy. No left inguinal adenopathy.  Skin:    General: Skin is warm and dry.  Neurological:     Mental Status: She is alert and oriented to person, place, and time.  Psychiatric:        Behavior: Behavior normal.        Thought Content: Thought content normal.        Judgment: Judgment normal.    Appointment on 07/14/2020  Component Date Value  Ref Range Status  . WBC 07/14/2020 5.6  4.0 - 10.5 K/uL Final  . RBC 07/14/2020 5.46* 3.87 - 5.11 MIL/uL Final  . Hemoglobin 07/14/2020 16.0* 12.0 - 15.0 g/dL Final  . HCT 07/14/2020 48.8* 36.0 - 46.0 % Final  . MCV 07/14/2020 89.4  80.0 - 100.0 fL Final  . MCH 07/14/2020 29.3  26.0 - 34.0 pg Final  . MCHC 07/14/2020 32.8  30.0 - 36.0 g/dL Final  . RDW 07/14/2020 13.1  11.5 - 15.5 % Final  . Platelets 07/14/2020 237  150 - 400 K/uL Final  . nRBC 07/14/2020 0.0  0.0 - 0.2 % Final   Performed at [facility], [address] [place], [place] [address]    Assessment:  Bailey Dennis is a 55 y.o. female with hereditary hemochromatosis.  She is homozygous for C282Y.  She was tested after her mothers and sister were diagnosed with hemochromatosis.  Labs on 02/05/2020 revealed a hematocrit of 47.7, hemoglobin 15.6, platelets 201,000, WBC 7,200. Ferritin was 27 with an iron saturation of 28% and TIBC of 331.1.  She has a history of anemia felt secondary to menorrhagia.  She is no longer on oral iron.  RUQ ultrasound on 04/07/2020 revealed a normal appearing liver. There was no focal liver lesion. There  was cholelithiasis.  AFP has been followed:  1.8 on 03/31/2020.  She has a history of erythrocytosis.  Hemoglobin was 16.1 on 03/31/2020 and 04/30/2020. Normal studies on 05/05/2020 included:  epo (7.5), carbon monoxide (2.0%), JAK2 V617F, exon 12-15, CALR, and MPL.  Urinalysis revealed trace hemoglobin, 6-10 RBC/HPF, and few bacteria.  Cystoscopy on 06/27/2020 was unremarkable.  No additional upper tract imaging was needed based on her risk stratification.  If she continues to have persistent microscopic hematuria, reevaluation was recommended in 2 - 3 years.  She had a colonoscopy on 07/2017 (no results available). Per patient, she had a few polyps and has to have colonoscopies every 5 years.  She received the COVID-19 vaccine on 08/27/2019 and 09/17/2019.  She had a mild case of COVID-19.  Symptomatically, she is doing well.  Diet is good.  She denies any melena, hematochezia or hematuria.  Exam is unremarkable.  Ferritin is 27  Plan: 1.   Labs today: CBC, ferritin. 2.   Hereditary hemochromatosis   Patient is homozygous for C282Y.  She has been postmenopausal for 14 months.    Ferritin has not been elevated.    Ferritin goal is 50-100.  RUQ ultrasound on 04/07/2020 revealed no liver lesions.    Continue RUQ ultrasound and AFP every 6 months for [facility] surveillance.  Continue to monitor. 3.   History of iron deficiency  Patient notes heavy menses.  Patient post-menopausal.  Ferritin was 27 (surprising low) on 02/05/2020 and 27 today.  Urinalysis confirmed microscopic hematuria.  Patient is status post colonoscopy in 07/2017 (no results available). 4.   Erythrocytosis  Hemoglobin 16.0 today.  Work-up revealed no evidence of a myeloproliferative disorder or secondary cause. 5.   Microscopic hematuria  Urinalysis on 05/05/2020 revealed trace hemoglobin and 6-10 RBC/HPF.  Renal ultrasound revealed no abnormality  Discuss urology work-up.  Cystoscopy was negative. 6.   RTC in 3 months for  MD assess, labs (CBC with diff, CMP, BCR-ABL, AFP, ferritin- day before), and +/- phlebotomy.  I discussed the assessment and treatment plan with the patient.  The patient was provided an opportunity to ask questions and all were answered.  The patient agreed with  the plan and demonstrated an understanding of the instructions.  The patient was advised to call back if the symptoms worsen or if the condition fails to improve as anticipated.   Murtaza Shell C. Mike Gip, MD, PhD    05/19/2020, 10:45 AM  I, Mirian Mo Tufford, am acting as Education administrator for [employer]. Mike Gip, MD, PhD.  I, Koji Niehoff C. Mike Gip, MD, have reviewed the above documentation for accuracy and completeness, and I agree with the above.

## 2020-09-23 ENCOUNTER — Telehealth: Payer: Self-pay | Admitting: Nurse Practitioner

## 2020-09-23 NOTE — Telephone Encounter (Signed)
09/23/2020 Left VM informing pt that labs on 5/16 have been moved to [facility] at 10:45 due to the [facility] clinic being closed. Encouraged pt to call if there were any questions or concerns  SRW

## 2020-10-01 DIAGNOSIS — E669 Obesity, unspecified: Secondary | ICD-10-CM | POA: Insufficient documentation

## 2020-10-03 ENCOUNTER — Other Ambulatory Visit: Payer: Self-pay

## 2020-10-06 ENCOUNTER — Other Ambulatory Visit: Payer: 59

## 2020-10-06 ENCOUNTER — Inpatient Hospital Stay: Payer: 59 | Attending: Nurse Practitioner

## 2020-10-06 LAB — COMPREHENSIVE METABOLIC PANEL
ALT: 16 U/L (ref 0–44)
AST: 18 U/L (ref 15–41)
Albumin: 4 g/dL (ref 3.5–5.0)
Alkaline Phosphatase: 82 U/L (ref 38–126)
Anion gap: 7 (ref 5–15)
BUN: 21 mg/dL — ABNORMAL HIGH (ref 6–20)
CO2: 27 mmol/L (ref 22–32)
Calcium: 8.9 mg/dL (ref 8.9–10.3)
Chloride: 103 mmol/L (ref 98–111)
Creatinine, Ser: 0.97 mg/dL (ref 0.44–1.00)
GFR, Estimated: >60 mL/min (ref >60)
Glucose, Bld: 101 mg/dL — ABNORMAL HIGH (ref 70–99)
Potassium: 4.2 mmol/L (ref 3.5–5.1)
Sodium: 137 mmol/L (ref 135–145)
Total Bilirubin: 0.9 mg/dL (ref 0.3–1.2)
Total Protein: 7.3 g/dL (ref 6.5–8.1)

## 2020-10-06 LAB — CBC WITH DIFFERENTIAL/PLATELET
Abs Immature Granulocytes: 0.03 10*3/uL (ref 0.00–0.07)
Basophils Absolute: 0 10*3/uL (ref 0.0–0.1)
Basophils Relative: 1 %
Eosinophils Absolute: 0.1 10*3/uL (ref 0.0–0.5)
Eosinophils Relative: 1 %
HCT: 46.6 % — ABNORMAL HIGH (ref 36.0–46.0)
Hemoglobin: 15.4 g/dL — ABNORMAL HIGH (ref 12.0–15.0)
Immature Granulocytes: 0 %
Lymphocytes Relative: 26 %
Lymphs Abs: 1.9 10*3/uL (ref 0.7–4.0)
MCH: 29.8 pg (ref 26.0–34.0)
MCHC: 33 g/dL (ref 30.0–36.0)
MCV: 90.3 fL (ref 80.0–100.0)
Monocytes Absolute: 0.5 10*3/uL (ref 0.1–1.0)
Monocytes Relative: 6 %
Neutro Abs: 4.8 10*3/uL (ref 1.7–7.7)
Neutrophils Relative %: 66 %
Platelets: 199 10*3/uL (ref 150–400)
RBC: 5.16 MIL/uL — ABNORMAL HIGH (ref 3.87–5.11)
RDW: 12.7 % (ref 11.5–15.5)
WBC: 7.3 10*3/uL (ref 4.0–10.5)
nRBC: 0 % (ref 0.0–0.2)

## 2020-10-06 LAB — FERRITIN: Ferritin: 30 ng/mL (ref 11–307)

## 2020-10-07 ENCOUNTER — Inpatient Hospital Stay: Payer: 59 | Admitting: Nurse Practitioner

## 2020-10-07 ENCOUNTER — Inpatient Hospital Stay: Payer: 59

## 2020-10-07 ENCOUNTER — Other Ambulatory Visit: Payer: Self-pay

## 2020-10-07 DIAGNOSIS — D751 Secondary polycythemia: Secondary | ICD-10-CM

## 2020-10-07 LAB — AFP TUMOR MARKER: AFP, Serum, Tumor Marker: 2.1 ng/mL (ref 0.0–9.2)

## 2020-10-07 NOTE — Progress Notes (Signed)
[facility]  [address], [address] [place], [place] [address] Phone: [phone]  Fax: [phone]   Clinic Day: 10/07/20  Referring physician: Sallee Lange, *  Chief Complaint: Bailey Dennis is a 55 y.o. female with hereditary hemochromatosis and erythrocytosis who is seen for 2 month assessment.   HPI: Bailey Dennis is a 55 y.o. female with hereditary hemochromatosis.  She is homozygous for C282Y.  She was tested after her mothers and sister were diagnosed with hemochromatosis.  Labs on 02/05/2020 revealed a hematocrit of 47.7, hemoglobin 15.6, platelets 201,000, WBC 7,200. Ferritin was 27 with an iron saturation of 28% and TIBC of 331.1.  She has a history of anemia felt secondary to menorrhagia.  She is no longer on oral iron.  RUQ ultrasound on 04/07/2020 revealed a normal appearing liver. There was no focal liver lesion. There was cholelithiasis.  AFP has been followed:  1.8 on 03/31/2020.  She has a history of erythrocytosis.  Hemoglobin was 16.1 on 03/31/2020 and 04/30/2020. Normal studies on 05/05/2020 included:  epo (7.5), carbon monoxide (2.0%), JAK2 V617F, exon 12-15, CALR, and MPL.  Urinalysis revealed trace hemoglobin, 6-10 RBC/HPF, and few bacteria.  Cystoscopy on 06/27/2020 was unremarkable.  No additional upper tract imaging was needed based on her risk stratification.  If she continues to have persistent microscopic hematuria, reevaluation was recommended in 2 - 3 years.  She had a colonoscopy on 07/2017 (no results available). Per patient, she had a few polyps and has to have colonoscopies every 5 years.  She received the COVID-19 vaccine on 08/27/2019 and 09/17/2019.  She had a mild case of COVID-19.  Interval History: Patient returnst o clinic for follow up and consideration of therapeutic phlebotomy. Feels well. Denies any neurologic complaints. Denies recent fevers or illnesses. Denies any easy bleeding or bruising. No melena or  hematochezia. Reports good appetite and denies weight loss. Denies chest pain. Denies any nausea, vomiting, constipation, or diarrhea. Denies urinary complaints. Patient offers no further specific complaints today.   Past Medical History:  Diagnosis Date   Thyroid disease     Past Surgical History:  Procedure Laterality Date   BREAST BIOPSY Left    neg    Family History  Problem Relation Age of Onset   Breast cancer Mother 46   Diabetes Father     Social History:  reports that she has never smoked. She has never used smokeless tobacco. She reports current alcohol use. She reports that she does not use drugs. She has a couple of glasses of wine on the weekends. She denies tobacco use. She denies exposure to radiation or toxins. She has two children (18 and 19). She works for a [employer] in [place]. The patient is alone today.  Allergies: No Known Allergies  Current Medications: Current Outpatient Medications  Medication Sig Dispense Refill   levothyroxine (SYNTHROID) 200 MCG tablet Take 200 mcg by mouth daily.     Multiple Vitamin (MULTI-VITAMINS) TABS Take by mouth.     No current facility-administered medications for this visit.    Review of Systems  Constitutional:  Negative for chills, fever, malaise/fatigue and weight loss.  HENT:  Negative for hearing loss, nosebleeds, sore throat and tinnitus.   Eyes:  Negative for blurred vision and double vision.  Respiratory:  Negative for cough, hemoptysis, shortness of breath and wheezing.   Cardiovascular:  Negative for chest pain, palpitations and leg swelling.  Gastrointestinal:  Negative for abdominal pain, blood in stool,  constipation, diarrhea, melena, nausea and vomiting.  Genitourinary:  Negative for dysuria and urgency.  Musculoskeletal:  Negative for back pain, falls, joint pain and myalgias.  Skin:  Negative for itching and rash.  Neurological:  Negative for dizziness, tingling, sensory change, loss of  consciousness, weakness and headaches.  Endo/Heme/Allergies:  Negative for environmental allergies. Does not bruise/bleed easily.  Psychiatric/Behavioral:  Negative for depression. The patient is not nervous/anxious and does not have insomnia.   Performance status (ECOG): 0  Vitals Blood pressure 132/62, pulse 76, temperature 97.8 F (36.6 C), temperature source Tympanic, resp. rate 18, weight 213 lb 13.5 oz (97 kg), SpO2 98 %.   General: Well-developed, well-nourished, no acute distress. Eyes: Pink conjunctiva, anicteric sclera. Lungs: Clear to auscultation bilaterally.  No audible wheezing or coughing Heart: Regular rate and rhythm.  Abdomen: Soft, nontender, nondistended.  Musculoskeletal: No edema, cyanosis, or clubbing. Neuro: Alert, answering all questions appropriately. Cranial nerves grossly intact. Skin: No rashes or petechiae noted. Psych: Normal affect.   Appointment on 10/06/2020  Component Date Value Ref Range Status   Sodium 10/06/2020 137  135 - 145 mmol/L Final   Potassium 10/06/2020 4.2  3.5 - 5.1 mmol/L Final   Chloride 10/06/2020 103  98 - 111 mmol/L Final   CO2 10/06/2020 27  22 - 32 mmol/L Final   Glucose, Bld 10/06/2020 101* 70 - 99 mg/dL Final   Glucose reference range applies only to samples taken after fasting for at least 8 hours.   BUN 10/06/2020 21* 6 - 20 mg/dL Final   Creatinine, Ser 10/06/2020 0.97  0.44 - 1.00 mg/dL Final   Calcium 10/06/2020 8.9  8.9 - 10.3 mg/dL Final   Total Protein 10/06/2020 7.3  6.5 - 8.1 g/dL Final   Albumin 10/06/2020 4.0  3.5 - 5.0 g/dL Final   AST 10/06/2020 18  15 - 41 U/L Final   ALT 10/06/2020 16  0 - 44 U/L Final   Alkaline Phosphatase 10/06/2020 82  38 - 126 U/L Final   Total Bilirubin 10/06/2020 0.9  0.3 - 1.2 mg/dL Final   GFR, Estimated 10/06/2020 >60  >60 mL/min Final   Comment: (NOTE) Calculated using the CKD-EPI Creatinine Equation (2021)    Anion gap 10/06/2020 7  5 - 15 Final   Performed at [facility], [place], [place] [address]   WBC 10/06/2020 7.3  4.0 - 10.5 K/uL Final   RBC 10/06/2020 5.16* 3.87 - 5.11 MIL/uL Final   Hemoglobin 10/06/2020 15.4* 12.0 - 15.0 g/dL Final   HCT 10/06/2020 46.6* 36.0 - 46.0 % Final   MCV 10/06/2020 90.3  80.0 - 100.0 fL Final   MCH 10/06/2020 29.8  26.0 - 34.0 pg Final   MCHC 10/06/2020 33.0  30.0 - 36.0 g/dL Final   RDW 10/06/2020 12.7  11.5 - 15.5 % Final   Platelets 10/06/2020 199  150 - 400 K/uL Final   nRBC 10/06/2020 0.0  0.0 - 0.2 % Final   Neutrophils Relative % 10/06/2020 66  % Final   Neutro Abs 10/06/2020 4.8  1.7 - 7.7 K/uL Final   Lymphocytes Relative 10/06/2020 26  % Final   Lymphs Abs 10/06/2020 1.9  0.7 - 4.0 K/uL Final   Monocytes Relative 10/06/2020 6  % Final   Monocytes Absolute 10/06/2020 0.5  0.1 - 1.0 K/uL Final   Eosinophils Relative 10/06/2020 1  % Final   Eosinophils Absolute 10/06/2020 0.1  0.0 - 0.5 K/uL Final   Basophils  Relative 10/06/2020 1  % Final   Basophils Absolute 10/06/2020 0.0  0.0 - 0.1 K/uL Final   Immature Granulocytes 10/06/2020 0  % Final   Abs Immature Granulocytes 10/06/2020 0.03  0.00 - 0.07 K/uL Final   Performed at [facility], [place], [place] [address]   AFP, Serum, Tumor Marker 10/06/2020 2.1  0.0 - 9.2 ng/mL Final   Comment: (NOTE) Roche Diagnostics Electrochemiluminescence Immunoassay (ECLIA) Values obtained with different assay methods or kits cannot be used interchangeably.  Results cannot be interpreted as absolute evidence of the presence or absence of malignant disease. This test is not interpretable in pregnant females. Performed At: [facility] [place], [place] [id number] Rush Farmer MD MG:[id number]    Ferritin 10/06/2020 30  11 - 307 ng/mL Final   Performed at [facility], [address] [place], [place] [address]    Assessment:   1.   Hereditary hemochromatosis - homozygous for C282Y. Ferritin  30 today. No need for phlebotomy. Screening for [place]- AFP normal. RUQ ultrasound on 04/07/2020 revealed no liver lesions.  Continue RUQ ultrasound and AFP every 6 months for [facility] surveillance. Continue to monitor.  2.   History of iron deficiency- r/t heavy menses. No post menopausal. monitor  3.   Erythrocytosis- hemoglobin 15.4 today. HCT 46.6. Previous workup for myeloproliferative disorder or secondary cause was negative. Monitor.   Plan: No phlebotomy today RTC in 3 months for labs (cbc, ferritin, iron studies). Few days to week later- MD to establish care, +/- phlebotomy  I discussed the assessment and treatment plan with the patient.  The patient was provided an opportunity to ask questions and all were answered.  The patient agreed with the plan and demonstrated an understanding of the instructions.  The patient was advised to call back if the symptoms worsen or if the condition fails to improve as anticipated.  Beckey Rutter, DNP, AGNP-C [place] at [facility]

## 2020-10-09 LAB — BCR-ABL1 FISH
Cells Analyzed: 200
Cells Counted: 200

## 2020-12-01 DIAGNOSIS — E042 Nontoxic multinodular goiter: Secondary | ICD-10-CM | POA: Insufficient documentation

## 2020-12-01 DIAGNOSIS — E559 Vitamin D deficiency, unspecified: Secondary | ICD-10-CM | POA: Insufficient documentation

## 2020-12-01 DIAGNOSIS — Z923 Personal history of irradiation: Secondary | ICD-10-CM | POA: Insufficient documentation

## 2021-01-06 ENCOUNTER — Inpatient Hospital Stay: Payer: 59 | Attending: Internal Medicine

## 2021-01-06 ENCOUNTER — Other Ambulatory Visit: Payer: Self-pay

## 2021-01-06 DIAGNOSIS — D751 Secondary polycythemia: Secondary | ICD-10-CM | POA: Diagnosis not present

## 2021-01-06 DIAGNOSIS — Z833 Family history of diabetes mellitus: Secondary | ICD-10-CM | POA: Insufficient documentation

## 2021-01-06 DIAGNOSIS — Z803 Family history of malignant neoplasm of breast: Secondary | ICD-10-CM | POA: Insufficient documentation

## 2021-01-06 DIAGNOSIS — Z79899 Other long term (current) drug therapy: Secondary | ICD-10-CM | POA: Insufficient documentation

## 2021-01-06 DIAGNOSIS — S0990XA Unspecified injury of head, initial encounter: Secondary | ICD-10-CM | POA: Insufficient documentation

## 2021-01-06 LAB — CBC WITH DIFFERENTIAL/PLATELET
Abs Immature Granulocytes: 0.02 10*3/uL (ref 0.00–0.07)
Basophils Absolute: 0.1 10*3/uL (ref 0.0–0.1)
Basophils Relative: 1 %
Eosinophils Absolute: 0.1 10*3/uL (ref 0.0–0.5)
Eosinophils Relative: 1 %
HCT: 45.8 % (ref 36.0–46.0)
Hemoglobin: 15.5 g/dL — ABNORMAL HIGH (ref 12.0–15.0)
Immature Granulocytes: 0 %
Lymphocytes Relative: 25 %
Lymphs Abs: 1.6 10*3/uL (ref 0.7–4.0)
MCH: 30.3 pg (ref 26.0–34.0)
MCHC: 33.8 g/dL (ref 30.0–36.0)
MCV: 89.5 fL (ref 80.0–100.0)
Monocytes Absolute: 0.5 10*3/uL (ref 0.1–1.0)
Monocytes Relative: 7 %
Neutro Abs: 4.1 10*3/uL (ref 1.7–7.7)
Neutrophils Relative %: 66 %
Platelets: 214 10*3/uL (ref 150–400)
RBC: 5.12 MIL/uL — ABNORMAL HIGH (ref 3.87–5.11)
RDW: 12.8 % (ref 11.5–15.5)
WBC: 6.3 10*3/uL (ref 4.0–10.5)
nRBC: 0 % (ref 0.0–0.2)

## 2021-01-06 LAB — FERRITIN: Ferritin: 40 ng/mL (ref 11–307)

## 2021-01-06 LAB — IRON AND TIBC
Iron: 97 ug/dL (ref 28–170)
Saturation Ratios: 32 % — ABNORMAL HIGH (ref 10.4–31.8)
TIBC: 301 ug/dL (ref 250–450)
UIBC: 204 ug/dL

## 2021-01-07 ENCOUNTER — Inpatient Hospital Stay (HOSPITAL_BASED_OUTPATIENT_CLINIC_OR_DEPARTMENT_OTHER): Payer: 59 | Admitting: Oncology

## 2021-01-07 ENCOUNTER — Inpatient Hospital Stay: Payer: 59

## 2021-01-07 ENCOUNTER — Encounter: Payer: Self-pay | Admitting: Oncology

## 2021-01-07 DIAGNOSIS — D751 Secondary polycythemia: Secondary | ICD-10-CM

## 2021-01-07 NOTE — Progress Notes (Signed)
Hematology/Oncology Consult note [facility]  [phone] [phone]  Patient Care Team: Sindy Guadeloupe, MD as PCP - General (Oncology)   Name of the patient: Bailey Dennis  [id number]  12/14/1965   Date of visit: 01/07/21  Diagnosis-hereditary hemochromatosis Polycythemia possibly secondary  Chief complaint/ Reason for visit-routine follow-up of hemochromatosis and polycythemia  Heme/Onc history: Patient is a 55 year old female who was found to be homozygous for C282Y gene for hereditary hemochromatosis after her mother was diagnosed with the same.  Patient has not required any phlebotomies so far.  She attained menopause sometime in 2020.  When she was premenopausal she was having significant menorrhagia and her ferritin levels were kept low at that time.  In fact back then she had evidence of iron deficiency anemia.  She has also undergone liver ultrasound which did not show any evidence of cirrhosis.  No family history of chronic liver disease.  BCR ABL testing in the past negative.  After menopause patient developed polycythemia and her hemoglobin has been between 15-16.  She did undergo JAK2 mutation testing which was negative.  EPO levels were normal.  CAL R mutation negative.  She is a non-smoker.  Interval history-patient feels well overall and denies any specific complaints at this time  ECOG PS- 0 Pain scale- -0   Review of systems- Review of Systems  Constitutional:  Negative for chills, fever, malaise/fatigue and weight loss.  HENT:  Negative for congestion, ear discharge and nosebleeds.   Eyes:  Negative for blurred vision.  Respiratory:  Negative for cough, hemoptysis, sputum production, shortness of breath and wheezing.   Cardiovascular:  Negative for chest pain, palpitations, orthopnea and claudication.  Gastrointestinal:  Negative for abdominal pain, blood in stool, constipation, diarrhea, heartburn, melena, nausea and  vomiting.  Genitourinary:  Negative for dysuria, flank pain, frequency, hematuria and urgency.  Musculoskeletal:  Negative for back pain, joint pain and myalgias.  Skin:  Negative for rash.  Neurological:  Negative for dizziness, tingling, focal weakness, seizures, weakness and headaches.  Endo/Heme/Allergies:  Does not bruise/bleed easily.  Psychiatric/Behavioral:  Negative for depression and suicidal ideas. The patient does not have insomnia.      No Known Allergies   Past Medical History:  Diagnosis Date   Thyroid disease      Past Surgical History:  Procedure Laterality Date   BREAST BIOPSY Left    neg    Social History   Socioeconomic History   Marital status: Married    Spouse name: Not on file   Number of children: Not on file   Years of education: Not on file   Highest education level: Not on file  Occupational History   Not on file  Tobacco Use   Smoking status: Never   Smokeless tobacco: Never  Vaping Use   Vaping Use: Never used  Substance and Sexual Activity   Alcohol use: Yes    Comment: occassionally   Drug use: Never   Sexual activity: Not on file  Other Topics Concern   Not on file  Social History Narrative   Not on file   Social Determinants of Health   Financial Resource Strain: Not on file  Food Insecurity: Not on file  Transportation Needs: Not on file  Physical Activity: Not on file  Stress: Not on file  Social Connections: Not on file  Intimate Partner Violence: Not on file    Family History  Problem Relation Age of Onset   Breast  cancer Mother 60   Diabetes Father      Current Outpatient Medications:    levothyroxine (SYNTHROID) 200 MCG tablet, Take 200 mcg by mouth daily., Disp: , Rfl:    Multiple Vitamin (MULTI-VITAMINS) TABS, Take by mouth., Disp: , Rfl:   Physical exam:  Vitals:   01/07/21 0916  BP: 116/71  Pulse: 74  Resp: 18  Temp: 98.6 F (37 C)  SpO2: 97%  Weight: 212 lb 10.1 oz (96.5 kg)   Physical  Exam Cardiovascular:     Rate and Rhythm: Normal rate and regular rhythm.     Heart sounds: Normal heart sounds.  Pulmonary:     Effort: Pulmonary effort is normal.     Breath sounds: Normal breath sounds.  Abdominal:     General: Bowel sounds are normal.     Palpations: Abdomen is soft.     Comments: No palpable hepatosplenomegaly  Skin:    General: Skin is warm and dry.  Neurological:     Mental Status: She is alert and oriented to person, place, and time.     CMP Latest Ref Rng & Units 10/06/2020  Glucose 70 - 99 mg/dL 101(H)  BUN 6 - 20 mg/dL 21(H)  Creatinine 0.44 - 1.00 mg/dL 0.97  Sodium 135 - 145 mmol/L 137  Potassium 3.5 - 5.1 mmol/L 4.2  Chloride 98 - 111 mmol/L 103  CO2 22 - 32 mmol/L 27  Calcium 8.9 - 10.3 mg/dL 8.9  Total Protein 6.5 - 8.1 g/dL 7.3  Total Bilirubin 0.3 - 1.2 mg/dL 0.9  Alkaline Phos 38 - 126 U/L 82  AST 15 - 41 U/L 18  ALT 0 - 44 U/L 16   CBC Latest Ref Rng & Units 01/06/2021  WBC 4.0 - 10.5 K/uL 6.3  Hemoglobin 12.0 - 15.0 g/dL 15.5(H)  Hematocrit 36.0 - 46.0 % 45.8  Platelets 150 - 400 K/uL 214    Assessment and plan- Patient is a 55 y.o. female who is here for follow-up of following issues:  Head injury hemochromatosis with homozygosity for C282Y.  LFTs are normal.  Ultrasound liver did not show any evidence of cirrhosis and therefore she does not require any HCC surveillance.  Goal is to keep ferritin less than 100 and she has not required any phlebotomies so far.  She has been postmenopausal for 2 years now and prior to that she had evidence of low ferritin due to iron deficiency.Today her ferritin levels are 40.  Phlebotomy would be indicated if ferritin is greater than 100.  Repeat CBC with differential CMP ferritin and iron studies in 6 months and I will see her thereafter for possible phlebotomy  2.  Polycythemia: Hemoglobin presently stable between 15-16.  JAK2 mutation testing was negative and therefore polycythemia vera is unlikely.   She is a lifetime never smoker.  Continue to monitor hemoglobin if there is a consistent increase in her hemoglobin I will consider getting a bone marrow biopsy at that time   Visit Diagnosis 1. Hereditary hemochromatosis ([place])   2. Polycythemia      Dr. Randa Evens, MD, MPH [facility] [phone] 01/07/2021 12:37 PM

## 2021-07-01 ENCOUNTER — Other Ambulatory Visit: Payer: Self-pay | Admitting: *Deleted

## 2021-07-08 ENCOUNTER — Encounter: Payer: Self-pay | Admitting: Oncology

## 2021-07-08 ENCOUNTER — Ambulatory Visit: Payer: 59 | Admitting: Oncology

## 2021-07-08 ENCOUNTER — Other Ambulatory Visit: Payer: 59

## 2021-07-08 ENCOUNTER — Inpatient Hospital Stay: Payer: 59 | Admitting: Oncology

## 2021-07-08 ENCOUNTER — Inpatient Hospital Stay: Payer: 59 | Attending: Oncology

## 2021-07-24 ENCOUNTER — Other Ambulatory Visit: Payer: Self-pay | Admitting: Nurse Practitioner

## 2021-07-24 DIAGNOSIS — Z1231 Encounter for screening mammogram for malignant neoplasm of breast: Secondary | ICD-10-CM

## 2021-11-25 ENCOUNTER — Ambulatory Visit
Admission: RE | Admit: 2021-11-25 | Discharge: 2021-11-25 | Disposition: A | Discharge status: Home / Self Care | Payer: 59 | Source: Ambulatory Visit | Attending: Nurse Practitioner | Admitting: Nurse Practitioner

## 2021-11-25 DIAGNOSIS — Z1231 Encounter for screening mammogram for malignant neoplasm of breast: Secondary | ICD-10-CM | POA: Insufficient documentation

## 2021-12-02 ENCOUNTER — Other Ambulatory Visit: Payer: Self-pay | Admitting: Nurse Practitioner

## 2021-12-02 DIAGNOSIS — N6489 Other specified disorders of breast: Secondary | ICD-10-CM

## 2021-12-02 DIAGNOSIS — R928 Other abnormal and inconclusive findings on diagnostic imaging of breast: Secondary | ICD-10-CM

## 2021-12-14 ENCOUNTER — Ambulatory Visit
Admission: RE | Admit: 2021-12-14 | Discharge: 2021-12-14 | Disposition: A | Discharge status: Home / Self Care | Payer: 59 | Source: Ambulatory Visit | Attending: Nurse Practitioner | Admitting: Nurse Practitioner

## 2021-12-14 DIAGNOSIS — R928 Other abnormal and inconclusive findings on diagnostic imaging of breast: Secondary | ICD-10-CM | POA: Diagnosis present

## 2021-12-14 DIAGNOSIS — N6489 Other specified disorders of breast: Secondary | ICD-10-CM | POA: Insufficient documentation

## 2022-03-16 ENCOUNTER — Ambulatory Visit
Admission: EM | Admit: 2022-03-16 | Discharge: 2022-03-16 | Disposition: A | Discharge status: Home / Self Care | Payer: 59 | Attending: Physician Assistant | Admitting: Physician Assistant

## 2022-03-16 ENCOUNTER — Encounter: Payer: Self-pay | Admitting: Emergency Medicine

## 2022-03-16 DIAGNOSIS — N179 Acute kidney failure, unspecified: Secondary | ICD-10-CM | POA: Insufficient documentation

## 2022-03-16 DIAGNOSIS — R509 Fever, unspecified: Secondary | ICD-10-CM

## 2022-03-16 DIAGNOSIS — R300 Dysuria: Secondary | ICD-10-CM | POA: Diagnosis present

## 2022-03-16 DIAGNOSIS — N10 Acute tubulo-interstitial nephritis: Secondary | ICD-10-CM

## 2022-03-16 LAB — COMPREHENSIVE METABOLIC PANEL
ALT: 21 U/L (ref 0–44)
AST: 21 U/L (ref 15–41)
Albumin: 3.8 g/dL (ref 3.5–5.0)
Alkaline Phosphatase: 84 U/L (ref 38–126)
Anion gap: 10 (ref 5–15)
BUN: 14 mg/dL (ref 6–20)
CO2: 23 mmol/L (ref 22–32)
Calcium: 8.5 mg/dL — ABNORMAL LOW (ref 8.9–10.3)
Chloride: 103 mmol/L (ref 98–111)
Creatinine, Ser: 1.15 mg/dL — ABNORMAL HIGH (ref 0.44–1.00)
GFR, Estimated: 57 mL/min — ABNORMAL LOW (ref >60)
Glucose, Bld: 105 mg/dL — ABNORMAL HIGH (ref 70–99)
Potassium: 3.5 mmol/L (ref 3.5–5.1)
Sodium: 136 mmol/L (ref 135–145)
Total Bilirubin: 1.1 mg/dL (ref 0.3–1.2)
Total Protein: 8.1 g/dL (ref 6.5–8.1)

## 2022-03-16 LAB — URINALYSIS, ROUTINE W REFLEX MICROSCOPIC
Bilirubin Urine: NEGATIVE
Glucose, UA: NEGATIVE mg/dL
Ketones, ur: 15 mg/dL — AB
Nitrite: POSITIVE — AB
Protein, ur: >300 mg/dL — AB
Specific Gravity, Urine: 1.01 (ref 1.005–1.030)
pH: 5.5 (ref 5.0–8.0)

## 2022-03-16 LAB — URINALYSIS, MICROSCOPIC (REFLEX): WBC, UA: >50 WBC/hpf (ref 0–5)

## 2022-03-16 LAB — CBC WITH DIFFERENTIAL/PLATELET
Abs Immature Granulocytes: 0.03 10*3/uL (ref 0.00–0.07)
Basophils Absolute: 0 10*3/uL (ref 0.0–0.1)
Basophils Relative: 0 %
Eosinophils Absolute: 0 10*3/uL (ref 0.0–0.5)
Eosinophils Relative: 0 %
HCT: 45 % (ref 36.0–46.0)
Hemoglobin: 15 g/dL (ref 12.0–15.0)
Immature Granulocytes: 0 %
Lymphocytes Relative: 11 %
Lymphs Abs: 1.1 10*3/uL (ref 0.7–4.0)
MCH: 29.2 pg (ref 26.0–34.0)
MCHC: 33.3 g/dL (ref 30.0–36.0)
MCV: 87.7 fL (ref 80.0–100.0)
Monocytes Absolute: 1.3 10*3/uL — ABNORMAL HIGH (ref 0.1–1.0)
Monocytes Relative: 12 %
Neutro Abs: 7.7 10*3/uL (ref 1.7–7.7)
Neutrophils Relative %: 77 %
Platelets: 256 10*3/uL (ref 150–400)
RBC: 5.13 MIL/uL — ABNORMAL HIGH (ref 3.87–5.11)
RDW: 12.9 % (ref 11.5–15.5)
WBC: 10.1 10*3/uL (ref 4.0–10.5)
nRBC: 0 % (ref 0.0–0.2)

## 2022-03-16 MED ORDER — ACETAMINOPHEN 500 MG PO TABS: 1000.0000 mg | ORAL_TABLET | Freq: Once | ORAL | Status: DC

## 2022-03-16 MED ORDER — CEFTRIAXONE SODIUM 1 G IJ SOLR
1.0000 g | Freq: Once | INTRAMUSCULAR | Status: AC
  Administered 2022-03-16: 1 g via INTRAMUSCULAR

## 2022-03-16 MED ORDER — CIPROFLOXACIN HCL 500 MG PO TABS: 500.0000 mg | ORAL_TABLET | Freq: Two times a day (BID) | ORAL | 0 refills | Status: AC

## 2022-03-16 MED ORDER — SODIUM CHLORIDE 0.9 % IV BOLUS
1000.0000 mL | Freq: Once | INTRAVENOUS | Status: AC
  Administered 2022-03-16: 1000 mL via INTRAVENOUS

## 2022-03-16 MED ORDER — ACETAMINOPHEN 500 MG PO TABS
1000.0000 mg | ORAL_TABLET | Freq: Once | ORAL | Status: AC
  Administered 2022-03-16: 1000 mg via ORAL

## 2022-03-16 NOTE — ED Triage Notes (Signed)
Pt c/o dysuria, lower back pain, subjective fever. Started 5 days ago.

## 2022-03-16 NOTE — Discharge Instructions (Addendum)
-  You have a kidney infection and a mild kidney injury due to dehydration.  We have given you fluids today, antibiotics, medication for the fever and I sent antibiotics to the pharmacy.  Take your first dose of the medication tonight.  Continue with Tylenol as needed for fever control but fever should be breaking in the next couple of days.  Make sure you are drinking lots of fluids. - If your fever is not breaking after 2 days, you feel increasingly weak, you have worse pain, go to ER.  UTI: Based on either symptoms or urinalysis, you may have a urinary tract infection. We will send the urine for culture and call with results in a few days. Begin antibiotics at this time. Your symptoms should be much improved over the next 2-3 days. Increase rest and fluid intake. If for some reason symptoms are worsening or not improving after a couple of days or the urine culture determines the antibiotics you are taking will not treat the infection, the antibiotics may be changed. Return or go to ER for fever, back pain, worsening urinary pain, discharge, increased blood in urine. May take Tylenol or Motrin OTC for pain relief or consider AZO if no contraindications

## 2022-03-16 NOTE — ED Provider Notes (Signed)
[facility]    CSN: [id number] Arrival date & time: 03/16/22  1516      History   Chief Complaint Chief Complaint  Patient presents with   Dysuria    HPI Bailey Dennis is a 56 y.o. female presenting for 5-day history of dysuria, urinary frequency and urgency.  Patient reports she has also felt feverish but not recorded a temperature.  She reports significant fatigue and states that she has been in bed for the past couple of days.  Reporting some right flank pain and lower abdominal pain.  Also reports nausea without vomiting.  States that she feels "woozy."  Patient reports that she took a COVID test at home to make sure she did not have COVID because she was feeling so badly even though she has not had a cough, congestion, sore throat, recent falls, chest pain.  COVID test was negative.  She has taken OTC meds as needed.  Patient denies any history of UTIs, pyelonephritis, kidney stones.  Her medical history significant for thyroid disease and hereditary hemochromatosis.  HPI  Past Medical History:  Diagnosis Date   Thyroid disease     Patient Active Problem List   Diagnosis Date Noted   Hx of radioactive iodine thyroid ablation 12/01/2020   Multinodular goiter (nontoxic) 12/01/2020   Vitamin D deficiency 12/01/2020   Obesity (BMI 30-39.9) 10/01/2020   Erythrocytosis 05/19/2020   Microscopic hematuria 05/19/2020   Hereditary hemochromatosis ([place]) 03/31/2020   Hypothyroidism, postablative 05/24/2013    Past Surgical History:  Procedure Laterality Date   BREAST BIOPSY Left    neg   BREAST EXCISIONAL BIOPSY Left     OB History   No obstetric history on file.      Home Medications    Prior to Admission medications   Medication Sig Start Date End Date Taking? Authorizing Provider  ciprofloxacin (CIPRO) 500 MG tablet Take 1 tablet (500 mg total) by mouth every 12 (twelve) hours for 7 days. 03/16/22 03/23/22 Yes Danton Clap, PA-C  levothyroxine  (SYNTHROID) 200 MCG tablet Take 200 mcg by mouth daily. 04/16/20  Yes [provider]  Multiple Vitamin (MULTI-VITAMINS) TABS Take by mouth.   Yes [provider]    Family History Family History  Problem Relation Age of Onset   Breast cancer Mother 20   Diabetes Father     Social History Social History   Tobacco Use   Smoking status: Never   Smokeless tobacco: Never  Vaping Use   Vaping Use: Never used  Substance Use Topics   Alcohol use: Yes    Comment: occassionally   Drug use: Never     Allergies   Patient has no known allergies.   Review of Systems Review of Systems  Constitutional:  Positive for fatigue. Negative for chills and fever.  HENT:  Negative for congestion.   Respiratory:  Negative for cough and shortness of breath.   Cardiovascular:  Negative for chest pain.  Gastrointestinal:  Positive for abdominal pain and nausea. Negative for diarrhea and vomiting.  Genitourinary:  Positive for dysuria, flank pain, frequency and urgency. Negative for decreased urine volume, hematuria, pelvic pain, vaginal bleeding, vaginal discharge and vaginal pain.  Musculoskeletal:  Positive for back pain.  Skin:  Negative for rash.  Neurological:  Negative for weakness.     Physical Exam Triage Vital Signs ED Triage Vitals  Enc Vitals Group     BP      Pulse  Resp      Temp      Temp src      SpO2      Weight      Height      Head Circumference      Peak Flow      Pain Score      Pain Loc      Pain Edu?      Excl. in [place]?    No data found.  Updated Vital Signs BP 126/81 (BP Location: Left Arm)   Pulse (!) 116   Temp (!) 102.4 F (39.1 C) (Oral)   Resp 18   Ht '5\' 7"'$  (1.702 m)   Wt 212 lb 11.9 oz (96.5 kg)   SpO2 95%   BMI 33.32 kg/m   Physical Exam Vitals and nursing note reviewed.  Constitutional:      General: She is not in acute distress.    Appearance: Normal appearance. She is not ill-appearing or toxic-appearing.   HENT:     Head: Normocephalic and atraumatic.  Eyes:     General: No scleral icterus.       Right eye: No discharge.        Left eye: No discharge.     Conjunctiva/sclera: Conjunctivae normal.  Cardiovascular:     Rate and Rhythm: Regular rhythm. Tachycardia present.     Heart sounds: Normal heart sounds.  Pulmonary:     Effort: Pulmonary effort is normal. No respiratory distress.     Breath sounds: Normal breath sounds.  Abdominal:     Palpations: Abdomen is soft.     Tenderness: There is abdominal tenderness (LLQ). There is right CVA tenderness. There is no left CVA tenderness.  Musculoskeletal:     Cervical back: Neck supple.  Skin:    General: Skin is dry.  Neurological:     General: No focal deficit present.     Mental Status: She is alert. Mental status is at baseline.     Motor: No weakness.     Gait: Gait normal.  Psychiatric:        Mood and Affect: Mood normal.        Behavior: Behavior normal.        Thought Content: Thought content normal.      UC Treatments / Results  Labs (all labs ordered are listed, but only abnormal results are displayed) Labs Reviewed  URINALYSIS, ROUTINE W REFLEX MICROSCOPIC - Abnormal; Notable for the following components:      Result Value   APPearance TURBID (*)    Hgb urine dipstick MODERATE (*)    Ketones, ur 15 (*)    Protein, ur >300 (*)    Nitrite POSITIVE (*)    Leukocytes,Ua LARGE (*)    All other components within normal limits  COMPREHENSIVE METABOLIC PANEL - Abnormal; Notable for the following components:   Glucose, Bld 105 (*)    Creatinine, Ser 1.15 (*)    Calcium 8.5 (*)    GFR, Estimated 57 (*)    All other components within normal limits  CBC WITH DIFFERENTIAL/PLATELET - Abnormal; Notable for the following components:   RBC 5.13 (*)    Monocytes Absolute 1.3 (*)    All other components within normal limits  URINALYSIS, MICROSCOPIC (REFLEX) - Abnormal; Notable for the following components:   Bacteria, UA  MANY (*)    All other components within normal limits  URINE CULTURE    EKG   Radiology No results found.  Procedures  Procedures (including critical care time)  Medications Ordered in UC Medications  cefTRIAXone (ROCEPHIN) injection 1 g (has no administration in time range)  acetaminophen (TYLENOL) tablet 1,000 mg (1,000 mg Oral Given 03/16/22 1652)  sodium chloride 0.9 % bolus 1,000 mL (1,000 mLs Intravenous New Bag/Given 03/16/22 1732)    Initial Impression / Assessment and Plan / UC Course  I have reviewed the triage vital signs and the nursing notes.  Pertinent labs & imaging results that were available during my care of the patient were reviewed by me and considered in my medical decision making (see chart for details).   56 year old female presents for 5-day history of dysuria, urinary frequency and urgency.  Also reporting right flank pain, significant fatigue and feeling feverish.  Temp is elevated at 102.4 degrees.  Pulse elevated at 116 bpm.  Blood pressure stable at 126/81.  Patient is nontoxic and in no distress.  She is mildly ill/fatigued appearing.  On exam her abdomen is soft.  She has some tenderness palpation of the left lower quadrant and right CVA tenderness mildly.  Chest is clear to auscultation.  UA shows turbid urine with moderate hemoglobin, ketones, protein, positive nitrites, large leukocytes.  Microscopic urinalysis shows white blood cell clumps, many bacteria and budding yeast.  Suspect patient has acute pyelonephritis.  Ordered CBC, CMP, urine culture.  Patient also advised that having some IV fluids with help her improve as well.  She agrees to IV fluids.  Patient also given 1 g IM Rocephin in clinic and will prescribe Cipro outpatient orally.  Patient given Tylenol in clinic for fever.  CBC without elevated WBC count.  CMP does show creatinine elevated at 1.15 and GFR reduced at 57.  This is a change from about 7 months ago when she had her kidney  function checked and it was normal.  Suspect mild acute kidney injury.  Treating with IV hydration and antibiotics.  Advised patient that she has acute pyelonephritis and acute kidney injury.  Sent Cipro to pharmacy for her to start and encouraged her to increase her fluid intake and rest.  Reviewed that we will contact her if we need to alter her antibiotic.  Thoroughly discussed going to the emergency department if any of her symptoms acutely worsen especially if she has a fever that is not going away in the next couple days, increased weakness/lethargy, vomiting, increased pain, sweats, etc.  Work note given.   Final Clinical Impressions(s) / UC Diagnoses   Final diagnoses:  Acute pyelonephritis  Dysuria  Fever, unspecified  Acute kidney injury ([place])     Discharge Instructions      -You have a kidney infection and a mild kidney injury due to dehydration.  We have given you fluids today, antibiotics, medication for the fever and I sent antibiotics to the pharmacy.  Take your first dose of the medication tonight.  Continue with Tylenol as needed for fever control but fever should be breaking in the next couple of days.  Make sure you are drinking lots of fluids. - If your fever is not breaking after 2 days, you feel increasingly weak, you have worse pain, go to ER.  UTI: Based on either symptoms or urinalysis, you may have a urinary tract infection. We will send the urine for culture and call with results in a few days. Begin antibiotics at this time. Your symptoms should be much improved over the next 2-3 days. Increase rest and fluid intake. If for some reason symptoms are  worsening or not improving after a couple of days or the urine culture determines the antibiotics you are taking will not treat the infection, the antibiotics may be changed. Return or go to ER for fever, back pain, worsening urinary pain, discharge, increased blood in urine. May take Tylenol or Motrin OTC for pain relief  or consider AZO if no contraindications      ED Prescriptions     Medication Sig Dispense Auth. Provider   ciprofloxacin (CIPRO) 500 MG tablet Take 1 tablet (500 mg total) by mouth every 12 (twelve) hours for 7 days. 14 tablet Gretta Cool      PDMP not reviewed this encounter.   Danton Clap, PA-C 03/16/22 [phone]

## 2022-03-19 LAB — URINE CULTURE: Culture: >=100000 — AB

## 2022-09-07 ENCOUNTER — Other Ambulatory Visit: Payer: Self-pay | Admitting: Nurse Practitioner

## 2022-09-07 DIAGNOSIS — Z1231 Encounter for screening mammogram for malignant neoplasm of breast: Secondary | ICD-10-CM

## 2023-02-07 ENCOUNTER — Ambulatory Visit
Admission: RE | Admit: 2023-02-07 | Discharge: 2023-02-07 | Disposition: A | Discharge status: Home / Self Care | Payer: 59 | Source: Ambulatory Visit | Attending: Nurse Practitioner | Admitting: Nurse Practitioner

## 2023-02-07 DIAGNOSIS — Z1231 Encounter for screening mammogram for malignant neoplasm of breast: Secondary | ICD-10-CM | POA: Insufficient documentation

## 2024-01-30 ENCOUNTER — Ambulatory Visit: Payer: Self-pay

## 2024-01-30 DIAGNOSIS — Z860101 Personal history of adenomatous and serrated colon polyps: Secondary | ICD-10-CM | POA: Diagnosis not present

## 2024-01-30 DIAGNOSIS — K5730 Diverticulosis of large intestine without perforation or abscess without bleeding: Secondary | ICD-10-CM | POA: Diagnosis not present

## 2024-01-30 DIAGNOSIS — Z09 Encounter for follow-up examination after completed treatment for conditions other than malignant neoplasm: Secondary | ICD-10-CM | POA: Diagnosis present

## 2024-01-30 DIAGNOSIS — K640 First degree hemorrhoids: Secondary | ICD-10-CM | POA: Diagnosis not present
